# Patient Record
Sex: Male | Born: 1975 | Race: Black or African American | Hispanic: No | Marital: Single | Smoking: Never smoker
Health system: Southern US, Community
[De-identification: ages and names within clinical notes are randomized; demographics above are authoritative.]

## PROBLEM LIST (undated history)

## (undated) DIAGNOSIS — K56609 Unspecified intestinal obstruction, unspecified as to partial versus complete obstruction: Secondary | ICD-10-CM

## (undated) DIAGNOSIS — I10 Essential (primary) hypertension: Secondary | ICD-10-CM

## (undated) DIAGNOSIS — D571 Sickle-cell disease without crisis: Secondary | ICD-10-CM

## (undated) DIAGNOSIS — J309 Allergic rhinitis, unspecified: Secondary | ICD-10-CM

## (undated) DIAGNOSIS — H918X9 Other specified hearing loss, unspecified ear: Secondary | ICD-10-CM

## (undated) DIAGNOSIS — R9431 Abnormal electrocardiogram [ECG] [EKG]: Secondary | ICD-10-CM

## (undated) DIAGNOSIS — N4830 Priapism, unspecified: Secondary | ICD-10-CM

## (undated) DIAGNOSIS — J449 Chronic obstructive pulmonary disease, unspecified: Secondary | ICD-10-CM

## (undated) DIAGNOSIS — R011 Cardiac murmur, unspecified: Secondary | ICD-10-CM

## (undated) DIAGNOSIS — I509 Heart failure, unspecified: Secondary | ICD-10-CM

## (undated) DIAGNOSIS — N19 Unspecified kidney failure: Secondary | ICD-10-CM

## (undated) DIAGNOSIS — H5440 Blindness, one eye, unspecified eye: Secondary | ICD-10-CM

## (undated) HISTORY — DX: Heart failure, unspecified: I50.9

## (undated) HISTORY — DX: Blindness, one eye, unspecified eye: H54.40

## (undated) HISTORY — DX: Allergic rhinitis, unspecified: J30.9

## (undated) HISTORY — DX: Priapism, unspecified: N48.30

## (undated) HISTORY — DX: Other specified hearing loss, unspecified ear: H91.8X9

## (undated) HISTORY — PX: APPENDECTOMY: SHX54

## (undated) HISTORY — DX: Essential (primary) hypertension: I10

## (undated) HISTORY — PX: CHOLECYSTECTOMY: SHX55

## (undated) HISTORY — DX: Sickle-cell disease without crisis: D57.1

## (undated) HISTORY — PX: OTHER SURGICAL HISTORY: SHX169

## (undated) HISTORY — DX: Unspecified kidney failure: N19

## (undated) HISTORY — DX: Abnormal electrocardiogram (ECG) (EKG): R94.31

## (undated) HISTORY — DX: Unspecified intestinal obstruction, unspecified as to partial versus complete obstruction: K56.609

## (undated) HISTORY — DX: Cardiac murmur, unspecified: R01.1

## (undated) HISTORY — DX: Chronic obstructive pulmonary disease, unspecified: J44.9

---

## 1998-03-13 ENCOUNTER — Emergency Department (HOSPITAL_COMMUNITY): Admission: EM | Admit: 1998-03-13 | Discharge: 1998-03-14 | Payer: Self-pay | Admitting: Emergency Medicine

## 1998-09-30 ENCOUNTER — Emergency Department (HOSPITAL_COMMUNITY): Admission: EM | Admit: 1998-09-30 | Discharge: 1998-09-30 | Payer: Self-pay | Admitting: Emergency Medicine

## 1999-08-15 ENCOUNTER — Encounter: Payer: Self-pay | Admitting: Internal Medicine

## 1999-08-15 ENCOUNTER — Inpatient Hospital Stay (HOSPITAL_COMMUNITY): Admission: EM | Admit: 1999-08-15 | Discharge: 1999-08-28 | Payer: Self-pay | Admitting: Emergency Medicine

## 1999-08-17 ENCOUNTER — Encounter: Payer: Self-pay | Admitting: Pulmonary Disease

## 1999-08-19 ENCOUNTER — Encounter: Payer: Self-pay | Admitting: Pulmonary Disease

## 1999-08-19 ENCOUNTER — Encounter: Payer: Self-pay | Admitting: Cardiology

## 1999-08-22 ENCOUNTER — Encounter: Payer: Self-pay | Admitting: Internal Medicine

## 1999-09-11 ENCOUNTER — Encounter: Payer: Self-pay | Admitting: Internal Medicine

## 1999-09-11 ENCOUNTER — Encounter: Admission: RE | Admit: 1999-09-11 | Discharge: 1999-09-11 | Payer: Self-pay | Admitting: Internal Medicine

## 2000-02-17 ENCOUNTER — Encounter: Payer: Self-pay | Admitting: Emergency Medicine

## 2000-02-18 ENCOUNTER — Observation Stay (HOSPITAL_COMMUNITY): Admission: EM | Admit: 2000-02-18 | Discharge: 2000-02-18 | Payer: Self-pay | Admitting: Emergency Medicine

## 2000-02-23 ENCOUNTER — Encounter: Payer: Self-pay | Admitting: Emergency Medicine

## 2000-02-24 ENCOUNTER — Inpatient Hospital Stay (HOSPITAL_COMMUNITY): Admission: EM | Admit: 2000-02-24 | Discharge: 2000-02-27 | Payer: Self-pay | Admitting: Emergency Medicine

## 2000-02-26 ENCOUNTER — Encounter: Payer: Self-pay | Admitting: Internal Medicine

## 2000-06-29 ENCOUNTER — Inpatient Hospital Stay (HOSPITAL_COMMUNITY): Admission: EM | Admit: 2000-06-29 | Discharge: 2000-07-01 | Payer: Self-pay | Admitting: Emergency Medicine

## 2000-06-29 ENCOUNTER — Encounter: Payer: Self-pay | Admitting: Internal Medicine

## 2000-08-11 ENCOUNTER — Inpatient Hospital Stay (HOSPITAL_COMMUNITY): Admission: EM | Admit: 2000-08-11 | Discharge: 2000-08-22 | Payer: Self-pay | Admitting: Emergency Medicine

## 2000-08-12 ENCOUNTER — Encounter: Payer: Self-pay | Admitting: Internal Medicine

## 2000-08-18 ENCOUNTER — Encounter: Payer: Self-pay | Admitting: Internal Medicine

## 2001-03-21 ENCOUNTER — Emergency Department (HOSPITAL_COMMUNITY): Admission: EM | Admit: 2001-03-21 | Discharge: 2001-03-21 | Payer: Self-pay | Admitting: Emergency Medicine

## 2001-03-21 ENCOUNTER — Encounter: Payer: Self-pay | Admitting: Emergency Medicine

## 2001-05-29 ENCOUNTER — Encounter: Payer: Self-pay | Admitting: Emergency Medicine

## 2001-05-30 ENCOUNTER — Inpatient Hospital Stay (HOSPITAL_COMMUNITY): Admission: EM | Admit: 2001-05-30 | Discharge: 2001-06-02 | Payer: Self-pay | Admitting: Emergency Medicine

## 2001-06-01 ENCOUNTER — Encounter: Payer: Self-pay | Admitting: Internal Medicine

## 2001-12-07 ENCOUNTER — Encounter: Payer: Self-pay | Admitting: Emergency Medicine

## 2001-12-08 ENCOUNTER — Encounter: Payer: Self-pay | Admitting: Internal Medicine

## 2001-12-08 ENCOUNTER — Inpatient Hospital Stay (HOSPITAL_COMMUNITY): Admission: EM | Admit: 2001-12-08 | Discharge: 2001-12-15 | Payer: Self-pay | Admitting: Emergency Medicine

## 2001-12-09 ENCOUNTER — Encounter: Payer: Self-pay | Admitting: Internal Medicine

## 2001-12-11 ENCOUNTER — Encounter: Payer: Self-pay | Admitting: Internal Medicine

## 2002-03-14 ENCOUNTER — Emergency Department (HOSPITAL_COMMUNITY): Admission: EM | Admit: 2002-03-14 | Discharge: 2002-03-14 | Payer: Self-pay | Admitting: Emergency Medicine

## 2002-03-16 ENCOUNTER — Inpatient Hospital Stay (HOSPITAL_COMMUNITY): Admission: AD | Admit: 2002-03-16 | Discharge: 2002-03-26 | Payer: Self-pay | Admitting: Internal Medicine

## 2002-03-17 ENCOUNTER — Encounter: Payer: Self-pay | Admitting: Internal Medicine

## 2002-03-24 ENCOUNTER — Encounter: Payer: Self-pay | Admitting: Internal Medicine

## 2002-04-01 ENCOUNTER — Encounter: Payer: Self-pay | Admitting: Emergency Medicine

## 2002-04-02 ENCOUNTER — Inpatient Hospital Stay (HOSPITAL_COMMUNITY): Admission: EM | Admit: 2002-04-02 | Discharge: 2002-04-14 | Payer: Self-pay | Admitting: Emergency Medicine

## 2002-04-05 ENCOUNTER — Encounter: Payer: Self-pay | Admitting: Internal Medicine

## 2002-04-05 ENCOUNTER — Encounter (INDEPENDENT_AMBULATORY_CARE_PROVIDER_SITE_OTHER): Payer: Self-pay | Admitting: Cardiology

## 2002-04-10 ENCOUNTER — Encounter: Payer: Self-pay | Admitting: Internal Medicine

## 2002-06-14 ENCOUNTER — Inpatient Hospital Stay (HOSPITAL_COMMUNITY): Admission: RE | Admit: 2002-06-14 | Discharge: 2002-06-15 | Payer: Self-pay | Admitting: Internal Medicine

## 2002-07-06 ENCOUNTER — Encounter: Admission: RE | Admit: 2002-07-06 | Discharge: 2002-07-06 | Payer: Self-pay | Admitting: Internal Medicine

## 2002-07-06 ENCOUNTER — Encounter: Payer: Self-pay | Admitting: Internal Medicine

## 2002-07-22 ENCOUNTER — Encounter: Payer: Self-pay | Admitting: Internal Medicine

## 2002-07-22 ENCOUNTER — Inpatient Hospital Stay (HOSPITAL_COMMUNITY): Admission: EM | Admit: 2002-07-22 | Discharge: 2002-07-28 | Payer: Self-pay | Admitting: Emergency Medicine

## 2002-07-26 ENCOUNTER — Encounter: Payer: Self-pay | Admitting: Internal Medicine

## 2002-08-13 ENCOUNTER — Encounter (HOSPITAL_COMMUNITY): Admission: RE | Admit: 2002-08-13 | Discharge: 2002-11-11 | Payer: Self-pay | Admitting: Internal Medicine

## 2002-11-12 ENCOUNTER — Inpatient Hospital Stay (HOSPITAL_COMMUNITY): Admission: AD | Admit: 2002-11-12 | Discharge: 2002-11-16 | Payer: Self-pay | Admitting: Internal Medicine

## 2003-02-10 ENCOUNTER — Encounter: Admission: RE | Admit: 2003-02-10 | Discharge: 2003-02-10 | Payer: Self-pay | Admitting: Urology

## 2003-02-10 ENCOUNTER — Encounter: Payer: Self-pay | Admitting: Urology

## 2003-02-14 ENCOUNTER — Ambulatory Visit (HOSPITAL_BASED_OUTPATIENT_CLINIC_OR_DEPARTMENT_OTHER): Admission: RE | Admit: 2003-02-14 | Discharge: 2003-02-14 | Payer: Self-pay | Admitting: Urology

## 2003-03-21 ENCOUNTER — Emergency Department (HOSPITAL_COMMUNITY): Admission: EM | Admit: 2003-03-21 | Discharge: 2003-03-21 | Payer: Self-pay | Admitting: Emergency Medicine

## 2003-05-05 ENCOUNTER — Emergency Department (HOSPITAL_COMMUNITY): Admission: EM | Admit: 2003-05-05 | Discharge: 2003-05-05 | Payer: Self-pay | Admitting: Emergency Medicine

## 2003-05-09 ENCOUNTER — Emergency Department (HOSPITAL_COMMUNITY): Admission: EM | Admit: 2003-05-09 | Discharge: 2003-05-09 | Payer: Self-pay | Admitting: Emergency Medicine

## 2003-07-04 ENCOUNTER — Emergency Department (HOSPITAL_COMMUNITY): Admission: EM | Admit: 2003-07-04 | Discharge: 2003-07-04 | Payer: Self-pay | Admitting: Emergency Medicine

## 2003-09-09 ENCOUNTER — Emergency Department (HOSPITAL_COMMUNITY): Admission: EM | Admit: 2003-09-09 | Discharge: 2003-09-09 | Payer: Self-pay | Admitting: *Deleted

## 2003-12-21 ENCOUNTER — Emergency Department (HOSPITAL_COMMUNITY): Admission: EM | Admit: 2003-12-21 | Discharge: 2003-12-21 | Payer: Self-pay | Admitting: Emergency Medicine

## 2004-05-16 ENCOUNTER — Encounter: Admission: RE | Admit: 2004-05-16 | Discharge: 2004-05-16 | Payer: Self-pay | Admitting: Internal Medicine

## 2004-09-11 ENCOUNTER — Inpatient Hospital Stay (HOSPITAL_COMMUNITY): Admission: EM | Admit: 2004-09-11 | Discharge: 2004-09-14 | Payer: Self-pay | Admitting: Emergency Medicine

## 2004-12-23 ENCOUNTER — Inpatient Hospital Stay (HOSPITAL_COMMUNITY): Admission: EM | Admit: 2004-12-23 | Discharge: 2004-12-31 | Payer: Self-pay | Admitting: *Deleted

## 2005-07-17 ENCOUNTER — Emergency Department (HOSPITAL_COMMUNITY): Admission: EM | Admit: 2005-07-17 | Discharge: 2005-07-17 | Payer: Self-pay | Admitting: Emergency Medicine

## 2005-12-24 ENCOUNTER — Emergency Department (HOSPITAL_COMMUNITY): Admission: EM | Admit: 2005-12-24 | Discharge: 2005-12-24 | Payer: Self-pay | Admitting: Emergency Medicine

## 2006-03-28 ENCOUNTER — Encounter: Admission: RE | Admit: 2006-03-28 | Discharge: 2006-03-28 | Payer: Self-pay | Admitting: Internal Medicine

## 2006-04-12 ENCOUNTER — Emergency Department (HOSPITAL_COMMUNITY): Admission: EM | Admit: 2006-04-12 | Discharge: 2006-04-12 | Payer: Self-pay | Admitting: Emergency Medicine

## 2006-07-14 ENCOUNTER — Encounter: Admission: RE | Admit: 2006-07-14 | Discharge: 2006-07-14 | Payer: Self-pay | Admitting: Internal Medicine

## 2006-09-17 ENCOUNTER — Emergency Department (HOSPITAL_COMMUNITY): Admission: EM | Admit: 2006-09-17 | Discharge: 2006-09-18 | Payer: Self-pay | Admitting: Emergency Medicine

## 2007-06-13 ENCOUNTER — Emergency Department (HOSPITAL_COMMUNITY): Admission: EM | Admit: 2007-06-13 | Discharge: 2007-06-13 | Payer: Self-pay | Admitting: Emergency Medicine

## 2008-06-22 ENCOUNTER — Emergency Department (HOSPITAL_COMMUNITY): Admission: EM | Admit: 2008-06-22 | Discharge: 2008-06-22 | Payer: Self-pay | Admitting: Emergency Medicine

## 2008-11-01 ENCOUNTER — Encounter: Admission: RE | Admit: 2008-11-01 | Discharge: 2008-11-01 | Payer: Self-pay | Admitting: Internal Medicine

## 2009-01-07 ENCOUNTER — Emergency Department (HOSPITAL_COMMUNITY): Admission: EM | Admit: 2009-01-07 | Discharge: 2009-01-08 | Payer: Self-pay | Admitting: Emergency Medicine

## 2009-07-06 ENCOUNTER — Emergency Department (HOSPITAL_COMMUNITY): Admission: EM | Admit: 2009-07-06 | Discharge: 2009-07-06 | Payer: Self-pay | Admitting: Emergency Medicine

## 2009-08-04 ENCOUNTER — Emergency Department (HOSPITAL_COMMUNITY): Admission: EM | Admit: 2009-08-04 | Discharge: 2009-08-04 | Payer: Self-pay | Admitting: Emergency Medicine

## 2009-08-08 ENCOUNTER — Emergency Department (HOSPITAL_COMMUNITY): Admission: EM | Admit: 2009-08-08 | Discharge: 2009-08-08 | Payer: Self-pay | Admitting: Emergency Medicine

## 2009-11-27 ENCOUNTER — Inpatient Hospital Stay (HOSPITAL_COMMUNITY): Admission: EM | Admit: 2009-11-27 | Discharge: 2009-11-29 | Payer: Self-pay | Admitting: Emergency Medicine

## 2010-06-17 ENCOUNTER — Emergency Department (HOSPITAL_COMMUNITY): Admission: EM | Admit: 2010-06-17 | Discharge: 2010-06-17 | Payer: Self-pay | Admitting: Emergency Medicine

## 2010-08-21 ENCOUNTER — Encounter: Admission: RE | Admit: 2010-08-21 | Discharge: 2010-08-21 | Payer: Self-pay | Admitting: Internal Medicine

## 2010-11-03 IMAGING — CR DG CHEST 2V
2 series · 2 of 2 positions shown · non-contrast
Comparison: 03/28/2006

CLINICAL DATA: Chest pain and shortness of breath history sickle
cell disease.

CHEST - 2 VIEW

[view not recorded (1 of 2)]
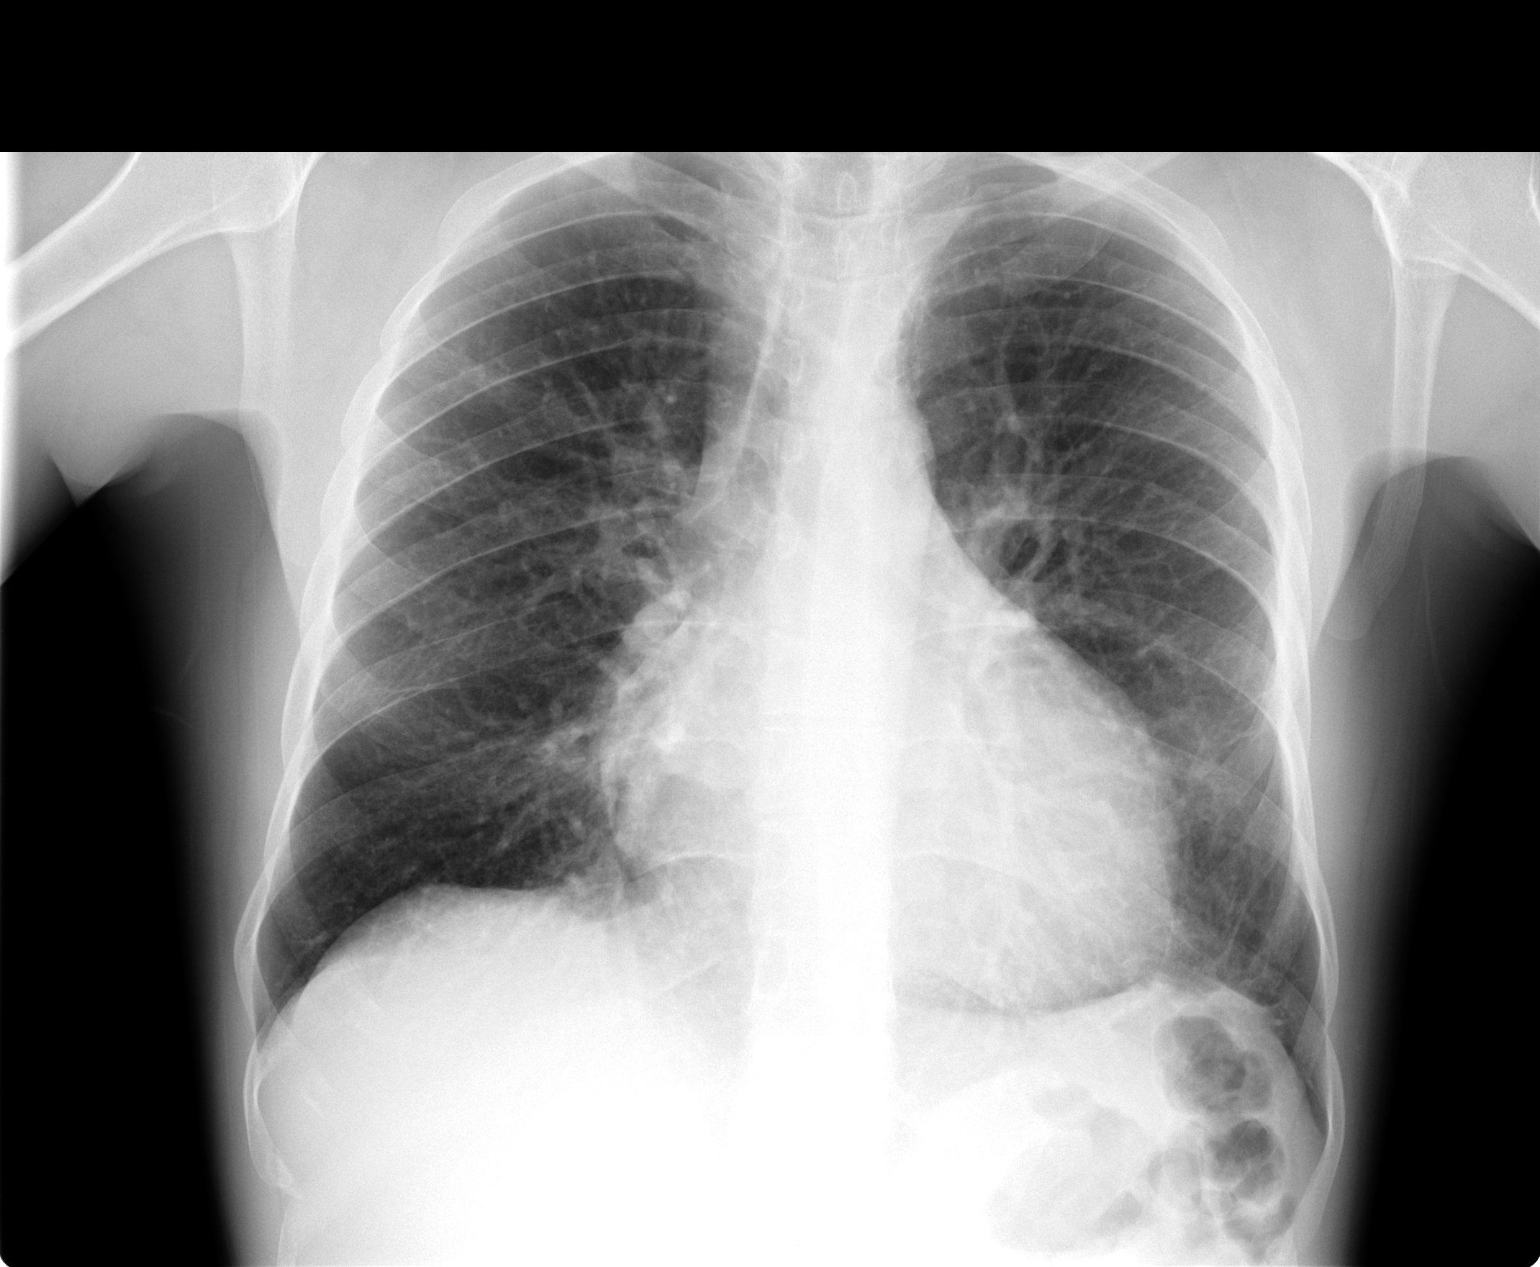

[view not recorded (2 of 2)]
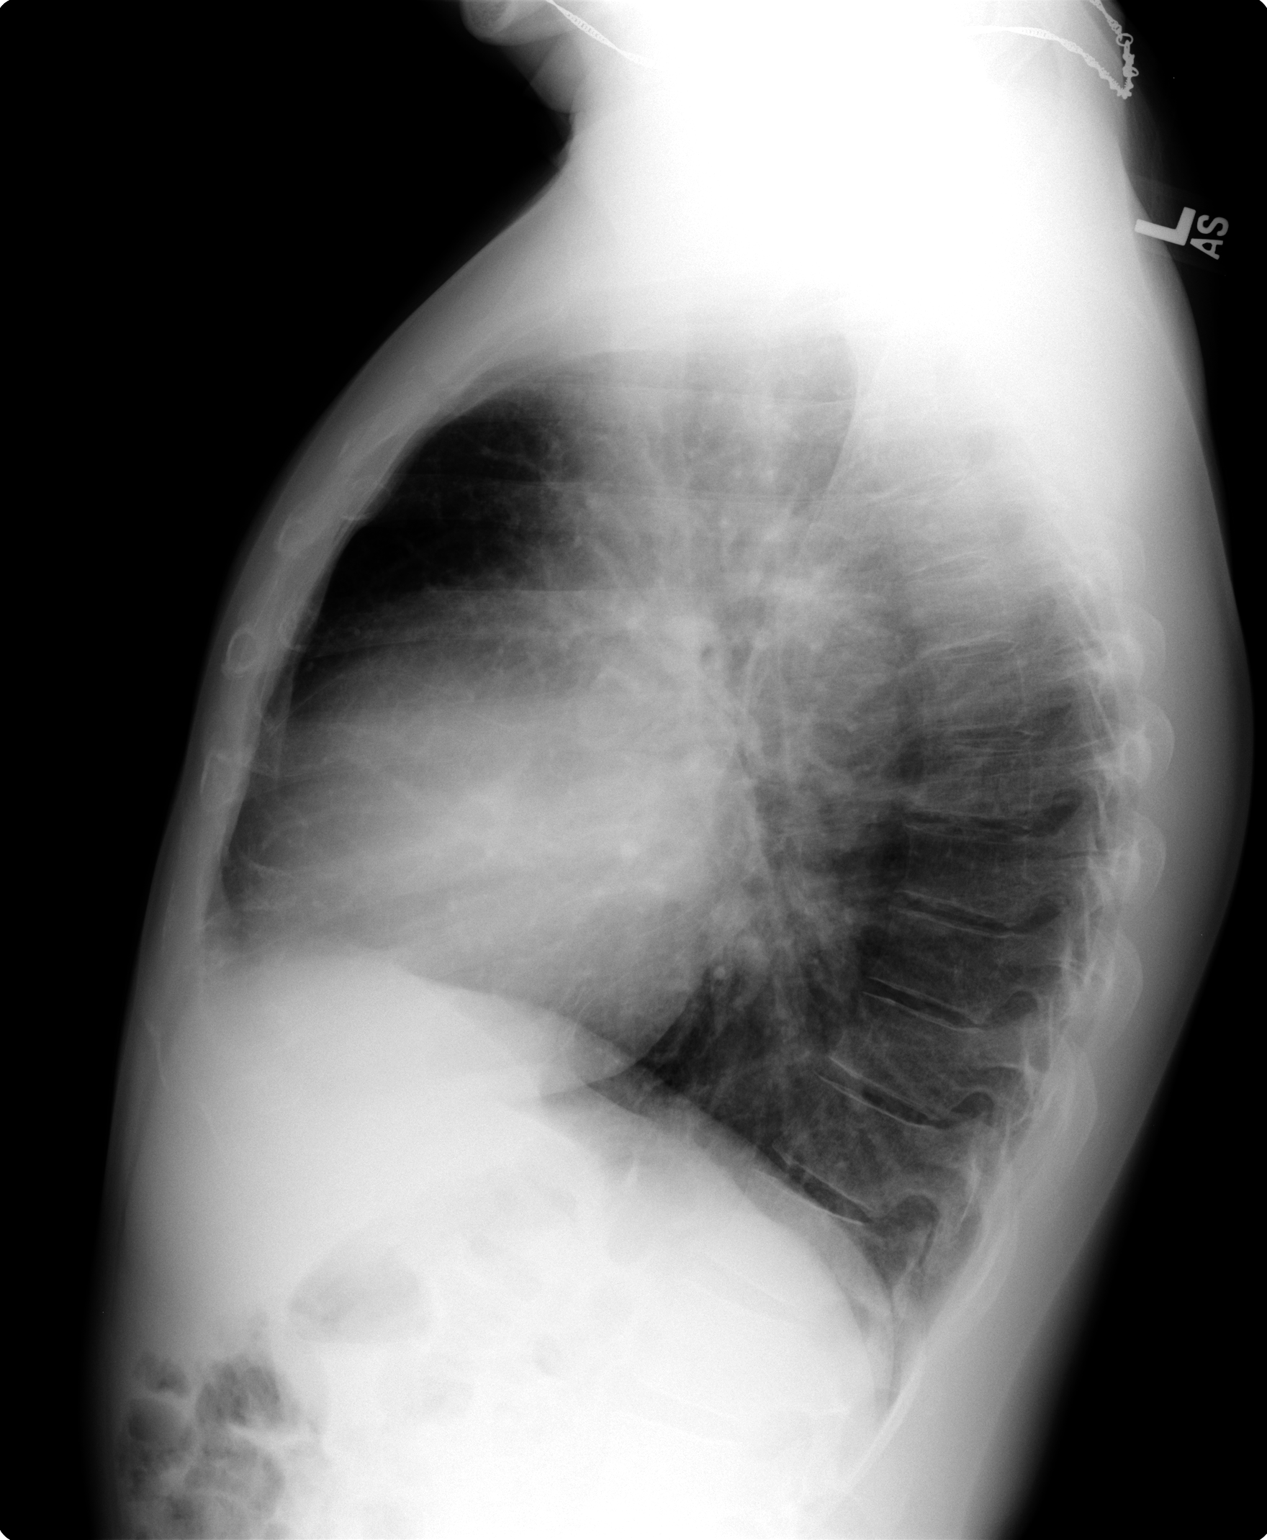

[2 of 2 positions shown; findings below may reference images not displayed]

FINDINGS: Minimal lingular scarring.  No active infiltrate.
Cardiomegaly.  Mild pulmonary vascular congestion.  No pulmonary
edema or pleural effusions.
IMPRESSION: Negative for pneumonia.  Cardiomegaly and mild pulmonary vascular
congestion.

REF:G3 DICTATED: 11/01/2008 [DATE]

## 2010-12-03 DIAGNOSIS — R9431 Abnormal electrocardiogram [ECG] [EKG]: Secondary | ICD-10-CM

## 2010-12-03 HISTORY — DX: Abnormal electrocardiogram (ECG) (EKG): R94.31

## 2010-12-05 ENCOUNTER — Other Ambulatory Visit (HOSPITAL_COMMUNITY): Payer: Self-pay | Admitting: Cardiology

## 2010-12-07 ENCOUNTER — Inpatient Hospital Stay (HOSPITAL_COMMUNITY)
Admission: EM | Admit: 2010-12-07 | Discharge: 2010-12-14 | DRG: 811 | Disposition: A | Discharge status: Home / Self Care | Payer: PRIVATE HEALTH INSURANCE | Attending: Internal Medicine | Admitting: Internal Medicine

## 2010-12-07 ENCOUNTER — Inpatient Hospital Stay (HOSPITAL_COMMUNITY): Payer: PRIVATE HEALTH INSURANCE

## 2010-12-07 DIAGNOSIS — J810 Acute pulmonary edema: Secondary | ICD-10-CM | POA: Diagnosis present

## 2010-12-07 DIAGNOSIS — E872 Acidosis, unspecified: Secondary | ICD-10-CM | POA: Diagnosis present

## 2010-12-07 DIAGNOSIS — J45909 Unspecified asthma, uncomplicated: Secondary | ICD-10-CM | POA: Diagnosis present

## 2010-12-07 DIAGNOSIS — R011 Cardiac murmur, unspecified: Secondary | ICD-10-CM | POA: Diagnosis present

## 2010-12-07 DIAGNOSIS — D5700 Hb-SS disease with crisis, unspecified: Principal | ICD-10-CM | POA: Diagnosis present

## 2010-12-07 DIAGNOSIS — R609 Edema, unspecified: Secondary | ICD-10-CM | POA: Diagnosis present

## 2010-12-07 DIAGNOSIS — E875 Hyperkalemia: Secondary | ICD-10-CM | POA: Diagnosis not present

## 2010-12-07 DIAGNOSIS — I129 Hypertensive chronic kidney disease with stage 1 through stage 4 chronic kidney disease, or unspecified chronic kidney disease: Secondary | ICD-10-CM | POA: Diagnosis present

## 2010-12-07 DIAGNOSIS — I2789 Other specified pulmonary heart diseases: Secondary | ICD-10-CM | POA: Diagnosis present

## 2010-12-07 DIAGNOSIS — N184 Chronic kidney disease, stage 4 (severe): Secondary | ICD-10-CM | POA: Diagnosis present

## 2010-12-07 DIAGNOSIS — H5440 Blindness, one eye, unspecified eye: Secondary | ICD-10-CM | POA: Diagnosis present

## 2010-12-07 DIAGNOSIS — M899 Disorder of bone, unspecified: Secondary | ICD-10-CM | POA: Diagnosis present

## 2010-12-07 DIAGNOSIS — D72829 Elevated white blood cell count, unspecified: Secondary | ICD-10-CM | POA: Diagnosis present

## 2010-12-07 LAB — PROTIME-INR
INR: 1.26 (ref 0.00–1.49)
Prothrombin Time: 16 seconds — ABNORMAL HIGH (ref 11.6–15.2)

## 2010-12-07 LAB — BASIC METABOLIC PANEL
BUN: 32 mg/dL — ABNORMAL HIGH (ref 6–23)
Chloride: 112 mEq/L (ref 96–112)
Creatinine, Ser: 3.39 mg/dL — ABNORMAL HIGH (ref 0.4–1.5)
GFR calc Af Amer: 25 mL/min — ABNORMAL LOW (ref >60)
GFR calc non Af Amer: 21 mL/min — ABNORMAL LOW (ref >60)
Potassium: 4.5 mEq/L (ref 3.5–5.1)

## 2010-12-07 LAB — DIFFERENTIAL
Basophils Absolute: 0.2 10*3/uL — ABNORMAL HIGH (ref 0.0–0.1)
Eosinophils Absolute: 0.2 10*3/uL (ref 0.0–0.7)
Lymphs Abs: 1.2 10*3/uL (ref 0.7–4.0)
Monocytes Relative: 11 % (ref 3–12)
Neutro Abs: 15.5 10*3/uL — ABNORMAL HIGH (ref 1.7–7.7)

## 2010-12-07 LAB — CBC
MCH: 40.2 pg — ABNORMAL HIGH (ref 26.0–34.0)
MCHC: 35.8 g/dL (ref 30.0–36.0)
Platelets: 315 10*3/uL (ref 150–400)
RDW: 23.1 % — ABNORMAL HIGH (ref 11.5–15.5)

## 2010-12-07 LAB — HEPATIC FUNCTION PANEL
Alkaline Phosphatase: 95 U/L (ref 39–117)
Bilirubin, Direct: 0.6 mg/dL — ABNORMAL HIGH (ref 0.0–0.3)
Indirect Bilirubin: 2.1 mg/dL — ABNORMAL HIGH (ref 0.3–0.9)
Total Bilirubin: 2.7 mg/dL — ABNORMAL HIGH (ref 0.3–1.2)

## 2010-12-07 LAB — RETICULOCYTES
RBC.: 1.32 MIL/uL — ABNORMAL LOW (ref 4.22–5.81)
Retic Ct Pct: 18.6 % — ABNORMAL HIGH (ref 0.4–3.1)

## 2010-12-07 LAB — APTT: aPTT: 38 seconds — ABNORMAL HIGH (ref 24–37)

## 2010-12-07 LAB — ABO/RH: ABO/RH(D): O POS

## 2010-12-07 LAB — IRON AND TIBC: TIBC: 239 ug/dL (ref 215–435)

## 2010-12-07 LAB — VITAMIN B12: Vitamin B-12: 807 pg/mL (ref 211–911)

## 2010-12-07 LAB — FOLATE: Folate: 7.8 ng/mL

## 2010-12-08 LAB — COMPREHENSIVE METABOLIC PANEL
ALT: 43 U/L (ref 0–53)
AST: 102 U/L — ABNORMAL HIGH (ref 0–37)
Alkaline Phosphatase: 93 U/L (ref 39–117)
CO2: 22 mEq/L (ref 19–32)
Calcium: 8.5 mg/dL (ref 8.4–10.5)
GFR calc Af Amer: 17 mL/min — ABNORMAL LOW (ref >60)
GFR calc non Af Amer: 14 mL/min — ABNORMAL LOW (ref >60)
Potassium: 5.6 mEq/L — ABNORMAL HIGH (ref 3.5–5.1)
Sodium: 140 mEq/L (ref 135–145)

## 2010-12-08 LAB — HEMOGLOBIN AND HEMATOCRIT, BLOOD
HCT: 20.8 % — ABNORMAL LOW (ref 39.0–52.0)
Hemoglobin: 7.2 g/dL — ABNORMAL LOW (ref 13.0–17.0)

## 2010-12-08 LAB — PROTIME-INR: Prothrombin Time: 16.4 seconds — ABNORMAL HIGH (ref 11.6–15.2)

## 2010-12-08 LAB — CARDIAC PANEL(CRET KIN+CKTOT+MB+TROPI): Relative Index: 1.2 (ref 0.0–2.5)

## 2010-12-08 LAB — CBC
Hemoglobin: 6.8 g/dL — CL (ref 13.0–17.0)
MCHC: 34.9 g/dL (ref 30.0–36.0)
RBC: 1.96 MIL/uL — ABNORMAL LOW (ref 4.22–5.81)
WBC: 15.7 10*3/uL — ABNORMAL HIGH (ref 4.0–10.5)

## 2010-12-08 LAB — APTT: aPTT: 45 seconds — ABNORMAL HIGH (ref 24–37)

## 2010-12-08 LAB — PREPARE RBC (CROSSMATCH)

## 2010-12-09 ENCOUNTER — Inpatient Hospital Stay (HOSPITAL_COMMUNITY): Payer: PRIVATE HEALTH INSURANCE

## 2010-12-09 LAB — TYPE AND SCREEN
ABO/RH(D): O POS
Antibody Screen: NEGATIVE
Unit division: 0
Unit division: 0
Unit division: 0

## 2010-12-09 LAB — COMPREHENSIVE METABOLIC PANEL
Alkaline Phosphatase: 87 U/L (ref 39–117)
BUN: 66 mg/dL — ABNORMAL HIGH (ref 6–23)
Calcium: 8.3 mg/dL — ABNORMAL LOW (ref 8.4–10.5)
Creatinine, Ser: 6.05 mg/dL — ABNORMAL HIGH (ref 0.4–1.5)
Glucose, Bld: 124 mg/dL — ABNORMAL HIGH (ref 70–99)
Total Protein: 5.7 g/dL — ABNORMAL LOW (ref 6.0–8.3)

## 2010-12-09 LAB — URINALYSIS, ROUTINE W REFLEX MICROSCOPIC
Leukocytes, UA: NEGATIVE
Nitrite: NEGATIVE
Protein, ur: >300 mg/dL — AB
Specific Gravity, Urine: 1.017 (ref 1.005–1.030)
Urobilinogen, UA: 0.2 mg/dL (ref 0.0–1.0)

## 2010-12-09 LAB — MAGNESIUM: Magnesium: 2.4 mg/dL (ref 1.5–2.5)

## 2010-12-09 LAB — CBC
HCT: 19.9 % — ABNORMAL LOW (ref 39.0–52.0)
MCH: 34.1 pg — ABNORMAL HIGH (ref 26.0–34.0)
MCHC: 35.2 g/dL (ref 30.0–36.0)
MCV: 97.1 fL (ref 78.0–100.0)
RDW: 22.2 % — ABNORMAL HIGH (ref 11.5–15.5)

## 2010-12-09 LAB — DIFFERENTIAL
Eosinophils Relative: 1 % (ref 0–5)
Lymphs Abs: 0.7 10*3/uL (ref 0.7–4.0)
Monocytes Relative: 13 % — ABNORMAL HIGH (ref 3–12)
Neutro Abs: 14.1 10*3/uL — ABNORMAL HIGH (ref 1.7–7.7)

## 2010-12-09 LAB — URINE MICROSCOPIC-ADD ON

## 2010-12-10 LAB — COMPREHENSIVE METABOLIC PANEL
ALT: 35 U/L (ref 0–53)
Alkaline Phosphatase: 97 U/L (ref 39–117)
BUN: 68 mg/dL — ABNORMAL HIGH (ref 6–23)
CO2: 21 mEq/L (ref 19–32)
Calcium: 9.1 mg/dL (ref 8.4–10.5)
GFR calc non Af Amer: 11 mL/min — ABNORMAL LOW (ref >60)
Glucose, Bld: 112 mg/dL — ABNORMAL HIGH (ref 70–99)
Sodium: 141 mEq/L (ref 135–145)

## 2010-12-10 LAB — DIFFERENTIAL
Basophils Relative: 1 % (ref 0–1)
Eosinophils Relative: 2 % (ref 0–5)
Lymphs Abs: 1.3 10*3/uL (ref 0.7–4.0)
Monocytes Absolute: 2.4 10*3/uL — ABNORMAL HIGH (ref 0.1–1.0)
Monocytes Relative: 13 % — ABNORMAL HIGH (ref 3–12)
Neutro Abs: 14.3 10*3/uL — ABNORMAL HIGH (ref 1.7–7.7)

## 2010-12-10 LAB — PHOSPHORUS: Phosphorus: 6.4 mg/dL — ABNORMAL HIGH (ref 2.3–4.6)

## 2010-12-10 LAB — CBC
HCT: 20.8 % — ABNORMAL LOW (ref 39.0–52.0)
Hemoglobin: 7.3 g/dL — ABNORMAL LOW (ref 13.0–17.0)
MCHC: 35.1 g/dL (ref 30.0–36.0)
MCV: 98.1 fL (ref 78.0–100.0)

## 2010-12-10 LAB — RAPID URINE DRUG SCREEN, HOSP PERFORMED
Amphetamines: NOT DETECTED
Cocaine: POSITIVE — AB
Opiates: POSITIVE — AB
Tetrahydrocannabinol: NOT DETECTED

## 2010-12-10 LAB — VITAMIN D 25 HYDROXY (VIT D DEFICIENCY, FRACTURES): Vit D, 25-Hydroxy: <10 ng/mL — ABNORMAL LOW (ref 30–89)

## 2010-12-10 LAB — C4 COMPLEMENT: Complement C4, Body Fluid: 21 mg/dL (ref 16–47)

## 2010-12-10 LAB — MAGNESIUM: Magnesium: 2.4 mg/dL (ref 1.5–2.5)

## 2010-12-10 LAB — PROTEIN / CREATININE RATIO, URINE: Creatinine, Urine: 73.3 mg/dL

## 2010-12-10 NOTE — Discharge Summary (Signed)
NAME:  [name], [name]           ACCOUNT NO.:  [id number]  MEDICAL RECORD NO.:  [id number]           PATIENT TYPE:  I  LOCATION:  1423                         FACILITY:  [facility]  PHYSICIAN:  Talmage Nap, MD  DATE OF BIRTH:  11/30/1975  DATE OF ADMISSION:  12/07/2010 DATE OF DISCHARGE:                        DISCHARGE SUMMARY - REFERRING   DATE OF DISCHARGE:  Undetermined.  PRIMARY CARE PHYSICIAN:  Eric L. August Saucer, M.D.  CARDIOLOGISTEduardo Osier Sharyn Lull, M.D.  CURRENT DIAGNOSES: 1. Sickle cell disease/bone pain crisis, resolving on Vicodin. 2. Anemia. 3. Sickle cell disease/anemia, status post packed rbc's transfusion. 4. Blindness, right eye. 5. Chronic kidney disease-blood urea nitrogen and creatinine trending     up. 6. Acidosis. 7. Hyperkalemia. 8. Hypertension. 9. Leukocytosis, source is unknown. 10.Pulmonary hypertension.  HISTORY:  The patient is a 35 year old very pleasant African American male with history of sickle cell disease and chronic renal insufficiency who was admitted to the hospital on December 07, 2010, by Dr. Calvert Cantor with 3-4 days' history of pain in the knees bilaterally.  There was however no associated systemic symptoms.  No chest pain, no shortness of breath.  No nausea or vomiting.  The patient was initially seen in his PCP's office and he was found to have hemoglobin level of 5.3 and in direct contact with his PCP, the patient was said to have complained about being fatigued and having shortness of breath on the evaluation but he denied any chest pain.  He was however sent to the hospital for further evaluation and most likely for blood transfusion.  PREADMISSION MEDICATIONS: 1. Uloric 40 mg p.o. daily. 2. Toprol XL 25 mg p.o. daily. 3. Singulair 10 mg p.o. daily. 4. Multivitamin 1 p.o. daily. 5. Hydroxyurea 500 mg 1 p.o. daily. 6. Diltiazem CD 120 mg p.o. at bedtime. 7. Folic acid 2 mg daily. 8. Aspirin 81 mg p.o. daily. 9. Calcium  600/400 with vitamin D 1 p.o. daily.  ALLERGIES:  He has no known allergies.  SOCIAL HISTORY:  Negative for alcohol or tobacco use and the patient is a Optician, dispensing.  FAMILY HISTORY:  York Spaniel to be positive for diabetes mellitus, hypertension, and dyslipidemia as well as blood dyscrasia.  REVIEW OF SYSTEMS:  Essentially documented in the initial history and physical.  PHYSICAL EXAMINATION:  GENERAL:  At the time the patient was seen by the admitting physician, he was said not to be in any acute distress. VITAL SIGNS:  Blood pressure 153/84, pulse 112, respiratory rate is 18, temperature is 99.3, and he was saturating 83% on room air, and when put on O2 via nasal cannula at 4 L, saturation improved to 95%. HEENT:  Blind right eyeball.  Left pupil is reactive to light and it was pale. NECK:  Had no jugular venous distention.  No carotid bruit.  No lymphadenopathy. CHEST:  Fine crackles bibasilar. HEART:  Sounds are S1 and S2 with soft systolic murmur. ABDOMEN:  Soft, nontender.  Liver, spleen, and kidney not palpable. Bowel sounds are positive. EXTREMITIES:  Show pedal edema. NEUROLOGIC:  Nonfocal. MUSCULOSKELETAL SYSTEM:  Unremarkable. SKIN:  Normal turgor.  LABORATORY DATA:  Coagulation profile showed PT  16.0, INR 1.26, and aPTT 38.  Basic metabolic panel showed sodium of 139, potassium of 4.5, chloride of 112 with a bicarb of 17, glucose is 148, BUN is 32, and creatinine is 3.39.  Retic count 18.6, and absolute reticulocyte count is 245.5.  Complete blood count with differential showed WBC of 19.2, hemoglobin of 5.3, hematocrit of 14.8, MCV of 112.1 with a platelet count of 315,000.  LFTs showed total bilirubin 2.7, direct 0.6, indirect 2.1, alkaline phosphatase 695, AST 46 and ALT 19.  Anemia panel done showed a serum iron 223 elevated, total iron binding capacity is 239, percentage saturation is 93, UIBC 16 and vitamin B12 of 827.  Cardiac markers, troponin-I 0.08.  A repeat  complete blood count with differential done on December 09, 2010, showed WBC of 17.5, hemoglobin of 7.0, hematocrit 19.9, MCV of 97.1, platelet count of 272,000, magnesium level is 2.4 and a comprehensive metabolic panel showed sodium of 141, potassium of 6.0, chloride of 115 with bicarb of 17, glucose is 124, BUN is 66, creatinine 6.05 and magnesium level is 2.4.  IMAGING STUDIES: 1. Chest x-ray which showed cardiomegaly with mild interstitial edema. 2. A 2-D echo showed mild LVH, EF of 50-55% with a PA pressure of 57     mmHg.  HOSPITAL COURSE:  The patient was admitted to telemetry.  He was started on normal saline IV to go at rate of 125 mL an hour.  He was typed and crossed, transfused with 2 units of packed rbc's.  He was also given heparin subcutaneous for DVT prophylaxis, and Tylenol and Percocet for control of pain.  Also given to the patient was Dilaudid 1-2 mg q.2h. p.r.n. and Benadryl 25-50 mg p.o. for pruritus.  Toprol and diltiazem were discontinued by the cardiologist, Dr. Sharyn Lull.  The patient was however placed on carvedilol 6.25 mg p.o. b.i.d.  Other medications given to the patient include folic acid 2 mg p.o. daily, hydroxyurea 500 mg p.o. daily, Singulair 10 mg p.o. daily, and aspirin 81 mg p.o. daily. At time the patient was seen by me for the very first time which was on December 08, 2010, he denied any chest pain or shortness of breath.  He admitted that the pain in the knees were less, and examination of the patient was essentially unremarkable apart from the patient being in pain and having soft systolic murmur.  Vital signs were stable, i.e. blood pressure 129/84, pulse 75, respiratory rate 20, temperature is 98.4.  After evaluating and going through the patient's lab work done which showed the patient had leukocytosis and source unknown and this cannot be totally attributed to hemolysis, because of this, the patient was started empirically on Rocephin 1 g IV  q.24h., and blood cultures x2 were ordered before starting IV antibiotics.  The patient was reevaluated by me today, which is December 09, 2010.  Denies any chest pain, however a review of his lab work showed that BUN and creatinine are trending up with hyperkalemia and acidosis.  The plan therefore is to continue the patient on Rocephin 1 g IV q.24h.  The patient will be given Kayexalate 15 mg p.o. stat, and Nephrology will be consulted.  He will be followed and evaluated on day-to-day basis.     Talmage Nap, MD     CN/MEDQ  D:  12/09/2010  T:  12/09/2010  Job:  [id number]  Electronically Signed by Talmage Nap  on 12/10/2010 06:41:44 AM

## 2010-12-11 ENCOUNTER — Inpatient Hospital Stay (HOSPITAL_COMMUNITY): Payer: PRIVATE HEALTH INSURANCE

## 2010-12-11 LAB — DIFFERENTIAL
Basophils Absolute: 0.2 10*3/uL — ABNORMAL HIGH (ref 0.0–0.1)
Basophils Relative: 1 % (ref 0–1)
Eosinophils Absolute: 0.4 10*3/uL (ref 0.0–0.7)
Lymphocytes Relative: 9 % — ABNORMAL LOW (ref 12–46)
Lymphs Abs: 1.6 10*3/uL (ref 0.7–4.0)
Monocytes Absolute: 3.5 10*3/uL — ABNORMAL HIGH (ref 0.1–1.0)
Neutrophils Relative %: 68 % (ref 43–77)

## 2010-12-11 LAB — COMPREHENSIVE METABOLIC PANEL
AST: 39 U/L — ABNORMAL HIGH (ref 0–37)
Albumin: 2.9 g/dL — ABNORMAL LOW (ref 3.5–5.2)
Alkaline Phosphatase: 85 U/L (ref 39–117)
Chloride: 108 mEq/L (ref 96–112)
GFR calc Af Amer: 15 mL/min — ABNORMAL LOW (ref >60)
Potassium: 4.3 mEq/L (ref 3.5–5.1)
Sodium: 140 mEq/L (ref 135–145)
Total Bilirubin: 1.8 mg/dL — ABNORMAL HIGH (ref 0.3–1.2)
Total Protein: 6.1 g/dL (ref 6.0–8.3)

## 2010-12-11 LAB — CBC
Platelets: 327 10*3/uL (ref 150–400)
RBC: 2.17 MIL/uL — ABNORMAL LOW (ref 4.22–5.81)
RDW: 25.7 % — ABNORMAL HIGH (ref 11.5–15.5)
WBC: 17.5 10*3/uL — ABNORMAL HIGH (ref 4.0–10.5)

## 2010-12-11 LAB — PTH, INTACT AND CALCIUM: Calcium, Total (PTH): 8.7 mg/dL (ref 8.4–10.5)

## 2010-12-11 LAB — FERRITIN: Ferritin: 1278 ng/mL — ABNORMAL HIGH (ref 22–322)

## 2010-12-11 LAB — BRAIN NATRIURETIC PEPTIDE: Pro B Natriuretic peptide (BNP): 543 pg/mL — ABNORMAL HIGH (ref 0.0–100.0)

## 2010-12-12 LAB — COMPREHENSIVE METABOLIC PANEL
Alkaline Phosphatase: 101 U/L (ref 39–117)
BUN: 66 mg/dL — ABNORMAL HIGH (ref 6–23)
CO2: 27 mEq/L (ref 19–32)
GFR calc non Af Amer: 15 mL/min — ABNORMAL LOW (ref >60)
Glucose, Bld: 118 mg/dL — ABNORMAL HIGH (ref 70–99)
Potassium: 4.2 mEq/L (ref 3.5–5.1)
Total Bilirubin: 1.8 mg/dL — ABNORMAL HIGH (ref 0.3–1.2)
Total Protein: 6.6 g/dL (ref 6.0–8.3)

## 2010-12-12 LAB — MAGNESIUM: Magnesium: 2.2 mg/dL (ref 1.5–2.5)

## 2010-12-12 LAB — CBC
MCHC: 33.2 g/dL (ref 30.0–36.0)
Platelets: 400 10*3/uL (ref 150–400)
RDW: 24.8 % — ABNORMAL HIGH (ref 11.5–15.5)
WBC: 12.7 10*3/uL — ABNORMAL HIGH (ref 4.0–10.5)

## 2010-12-12 LAB — BRAIN NATRIURETIC PEPTIDE: Pro B Natriuretic peptide (BNP): 563 pg/mL — ABNORMAL HIGH (ref 0.0–100.0)

## 2010-12-13 LAB — COMPREHENSIVE METABOLIC PANEL
ALT: 41 U/L (ref 0–53)
AST: 54 U/L — ABNORMAL HIGH (ref 0–37)
Albumin: 3.1 g/dL — ABNORMAL LOW (ref 3.5–5.2)
CO2: 28 mEq/L (ref 19–32)
Chloride: 103 mEq/L (ref 96–112)
GFR calc Af Amer: 21 mL/min — ABNORMAL LOW (ref >60)
GFR calc non Af Amer: 17 mL/min — ABNORMAL LOW (ref >60)
Sodium: 142 mEq/L (ref 135–145)
Total Bilirubin: 1.5 mg/dL — ABNORMAL HIGH (ref 0.3–1.2)

## 2010-12-13 LAB — CBC
HCT: 16.1 % — ABNORMAL LOW (ref 39.0–52.0)
Hemoglobin: 8.1 g/dL — ABNORMAL LOW (ref 13.0–17.0)
Hemoglobin: 6.1 g/dL — CL (ref 13.0–17.0)
MCH: 43 pg — ABNORMAL HIGH (ref 26.0–34.0)
MCHC: 37.8 g/dL — ABNORMAL HIGH (ref 30.0–36.0)
Platelets: 409 10*3/uL — ABNORMAL HIGH (ref 150–400)
RBC: 2.39 MIL/uL — ABNORMAL LOW (ref 4.22–5.81)
RDW: 24.8 % — ABNORMAL HIGH (ref 11.5–15.5)
WBC: 12.2 10*3/uL — ABNORMAL HIGH (ref 4.0–10.5)

## 2010-12-13 LAB — DIFFERENTIAL
Eosinophils Relative: 0 % (ref 0–5)
Lymphs Abs: 1.4 10*3/uL (ref 0.7–4.0)
Monocytes Absolute: 1.7 10*3/uL — ABNORMAL HIGH (ref 0.1–1.0)
Monocytes Relative: 10 % (ref 3–12)
Neutro Abs: 13.8 10*3/uL — ABNORMAL HIGH (ref 1.7–7.7)

## 2010-12-13 LAB — BASIC METABOLIC PANEL
BUN: 33 mg/dL — ABNORMAL HIGH (ref 6–23)
CO2: 20 mEq/L (ref 19–32)
Calcium: 8.9 mg/dL (ref 8.4–10.5)
GFR calc non Af Amer: 20 mL/min — ABNORMAL LOW (ref >60)
Glucose, Bld: 92 mg/dL (ref 70–99)
Potassium: 4.3 mEq/L (ref 3.5–5.1)
Sodium: 140 mEq/L (ref 135–145)

## 2010-12-13 LAB — URINALYSIS, ROUTINE W REFLEX MICROSCOPIC
Glucose, UA: NEGATIVE mg/dL
Leukocytes, UA: NEGATIVE
Protein, ur: >300 mg/dL — AB
Specific Gravity, Urine: 1.013 (ref 1.005–1.030)
pH: 6 (ref 5.0–8.0)

## 2010-12-13 LAB — URINE MICROSCOPIC-ADD ON

## 2010-12-14 LAB — COMPREHENSIVE METABOLIC PANEL
ALT: 37 U/L (ref 0–53)
AST: 51 U/L — ABNORMAL HIGH (ref 0–37)
Alkaline Phosphatase: 107 U/L (ref 39–117)
CO2: 30 mEq/L (ref 19–32)
Calcium: 9.1 mg/dL (ref 8.4–10.5)
GFR calc Af Amer: 21 mL/min — ABNORMAL LOW (ref >60)
GFR calc non Af Amer: 18 mL/min — ABNORMAL LOW (ref >60)
Glucose, Bld: 76 mg/dL (ref 70–99)
Potassium: 4.2 mEq/L (ref 3.5–5.1)
Sodium: 141 mEq/L (ref 135–145)

## 2010-12-14 LAB — CULTURE, BLOOD (ROUTINE X 2)
Culture  Setup Time: 201203102025
Culture  Setup Time: 201203102025
Culture: NO GROWTH

## 2010-12-14 LAB — CBC
HCT: 24.2 % — ABNORMAL LOW (ref 39.0–52.0)
Hemoglobin: 8 g/dL — ABNORMAL LOW (ref 13.0–17.0)
MCHC: 33.1 g/dL (ref 30.0–36.0)
RBC: 2.36 MIL/uL — ABNORMAL LOW (ref 4.22–5.81)
WBC: 9.6 10*3/uL (ref 4.0–10.5)

## 2010-12-17 NOTE — Consult Note (Signed)
NAME:  Douglas, Hardin           ACCOUNT NO.:  [id number]  MEDICAL RECORD NO.:  [id number]           PATIENT TYPE:  I  LOCATION:  1423                         FACILITY:  [facility]  PHYSICIAN:  Maree Krabbe, M.D.DATE OF BIRTH:  1976/06/23  DATE OF CONSULTATION:  12/09/2010 DATE OF DISCHARGE:                                CONSULTATION   REASON FOR CONSULTATION:  Acute-on-chronic renal failure.  HISTORY OF PRESENT ILLNESS:  The patient is a 35 year old pleasant male with a history of sickle-cell disease and chronic kidney disease who was admitted on March 9th with painful crisis and acute-on-chronic anemia. The patient's renal function has deteriorated over the hospital in the last 48 hours and we were asked to see the patient in consultation.  The patient was having pain in both knees about 3-4 days prior to admission.  He had no other associated systemic symptoms, chest pain or shortness of breath, nausea, vomiting or diarrhea.  He was seen in his PCP's office, found to have a hemoglobin level of 5.3 and also was having marked fatigue.  He was sent to the hospital and admitted on March 9th.  On admission, his creatinine was 3.3.  The patient was started on IV fluids at 125 cc/hour and given a blood transfusion of 2 units packed red blood cells.  He received p.r.n. Tylenol and hydrocodone. He also received Dilaudid and Benadryl as needed.  The patient was seen by Cardiology in consultation and his Toprol-XL and diltiazem were stopped.  He was on Coreg 6.25 mg b.i.d. This is by Dr. Lorayne Marek.  A 2-D echo was ordered, which showed normal LV function with an EF of 50% to 55%, concentric LVH and no diastolic dysfunction.  There is some pulmonary hypertension, but the right ventricle had normal systolic function.  Creatinine bumped up to 4.7 on the 2nd hospital day.  He got another unit of blood on the same day. White blood count bumped up also.  There was no fever.  Blood  cultures were drawn and the patient was given one dose of Rocephin on December 08, 2010.  The labs this morning showed creatinine rising further up to 6.0 with potassium of 6.0 and the patient was given Kayexalate 15 g by mouth and nephrology consult was called.  Ultrasound of the kidneys was done today, which showed a right kidney of 10.7 cm and left kidney 11.5 cm. Both kidneys were diffusely echogenic.  No hydronephrosis.  Urinalysis shows greater than 300 protein.  No red blood cells.  Few white blood cells.  Other lab work shows albumin 2.9.  X-ray from admission showed interstitial edema, vascular congestion, cardiomegaly and a patchy infiltrate in the lingula.  Currently the patient says he feels more energy than when he came in the hospital.  He denies any active shortness of breath or chest pain.  Denies any nausea or vomiting.  He says his appetite is excellent.  He is not having any difficulty voiding.  PAST MEDICAL HISTORY:  Chronic kidney disease C.  In 2007, creatinine 0.9.  In 2008, 1.2.  In 2010, 1.6.  In February 2011, 2.8 - 3.0. September 2011,  3.5.  One week ago according to the patient, it was 2.7. Also sickle cell disease, anemia, blindness in the right eye, hypertension of less than 2 years duration and pulmonary hypertension. Asthma.  PAST SURGICAL HISTORY:  Noncontributory.  HOME MEDICATIONS: 1. Uloric. 2. Toprol-XL 25 daily. 3. Singulair 10 daily. 4. Multivitamin. 5. Hydroxyurea 500 one daily. 6. Diltiazem CD 120 at bedtime. 7. Folic acid. 8. Aspirin 81. 9. Calcium with vitamin D.  CURRENT MEDICATIONS: 1. Uloric. 2. Singulair. 3. Multivitamin. 4. Hydroxyurea. 5. Folic acid. 6. Aspirin. 7. Calcium carbonate. 8. Coreg 6.25 b.i.d. 9. Rocephin 1 g IV q.24 hours. 10.P.r.n. medications and pain meds.  No NSAIDs.  The patient has not     received any IV contrast.  ALLERGIES:  None.  SOCIAL HISTORY:  No alcohol or tobacco use.  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  Primary physician is Willey Blade, M.D.  No drug use.  REVIEW OF SYSTEMS:  Denies any recent fever, chills, sweats, headache, visual change, sore throat, difficulty swallowing, chest pain, shortness of breath, orthopnea or PND.  GI:  As above.  GU:  No difficulty voiding or dysuria.  No history of kidney stones or prostate disease. MUSCULOSKELETAL:  Denies any history of arthritis.  He has joint pains related to his sickle cell disease off and on.  He has had ankle swelling here for the last 3 weeks, which is new.  History of asthma. HEMATOLOGIC:  Denies any history of blood clot or cancer.  He has had blood transfusion in the past, but not in excessive amount.  Denies any focal numbness, weakness, history of stroke, TIA or seizure.  SKIN: Denies any skin rash or skin condition.  PHYSICAL EXAMINATION:  VITAL SIGNS:  Blood pressure 119/69, pulse 80, respirations 18, temperature 99.6. GENERAL:  The patient is a well-developed, well-nourished male, in no distress, sitting up, pleasant and calm and fully alert and oriented x3. SKIN:  Warm and dry without rash or cyanosis. HEENT:  PERRLA, EOMI.  Throat is clear and moist. NECK:  Supple.  There is JVD with significantly distended neck veins up to the angle of the jaw. CHEST:  He has bilateral basilar crackles about one-quarter of the posterior lung fields.  No rhonchi or wheezing.  No bronchial breath sounds.  Good air movement. CARDIAC:  Regular rate and rhythm without murmur, rub or gallop.  There may be a soft S4. ABDOMEN:  Slightly distended, nontender, tympanitic.  The liver is enlarged and descends about 5 or 6 cm below the right costal margin. Spleen tip is not palpated.  No obvious ascites and no abdominal tenderness. GU:  Normal male genitalia. EXTREMITIES:  There is pitting edema of the lower legs below the knees bilaterally and also the thighs, right greater than left, about 2+.  No edema of the upper  extremities or face. NEUROLOGIC:  Alert and oriented x3.  Ambulatory.  No focal deficits.  LABORATORY DATA:  From today, sodium 141, potassium 6.0, CO2 is 17, BUN 66, creatinine 6.05, glucose 124, magnesium 2.4, estimated creatinine clearance today is 39 mL per minute, iron saturation 93%, ferritin 764, total bilirubin 2.2, calcium 8.3, phosphorus pending.  Chest x-ray results as above.  Urinalysis as above.  Renal ultrasound as above.  IMPRESSION/PLAN: 1. Acute-on-chronic renal failure.  Uncertain etiology.  There is some     relative drop in blood pressure compared to admission with a drop     in systolic of about 20-30 points, but  no frank hypotension.  The     patient is clearly volume overloaded and I suppose it is possible     that the patient has fallen off the Starling curve and this is a     hemodynamic response, although on the echocardiogram, it looks like     the patient the left and right ventricle function properly.  I do     not see any indication of nephrotoxin administration.  He has only     received one dose of cephalosporin antibiotic making acute allergic     interstitial nephritis unlikely.  No IV contrast, NSAIDs, ACE     inhibitors or ARBs.  The patient does have a significant chronic     kidney disease element, which is stage IV with a baseline GFR     around 25% to 30% (creatinine 2.7-3.3).     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  He does have     proteinuria by dipstick greater than 300 and we will quantitate     this.  There is no significant hematuria to suggest     glomerulonephritis.  For now, we will treat by stopping IV fluids,     give IV Lasix at relatively high doses to try to promote a     diuresis and hopefully the patient will have a hemodynamic     improvement and we can turn around the acute element of his renal     insufficiency.  If not, he may require dialysis within the next few     days.  This was all explained to the  patient who seemed to     understand and I answered his questions accordingly. 2. Chronic kidney disease, as above. 3. Sickle cell disease. 4. Anemia with acute-on-chronic worsening, status post transfusion (3     units of packed red blood cells so far in this admission).     Hemoglobin is up to 7 from the low 5. 5. Infectious disease.  The patient was started on Rocephin for rising     white count of 16,000 to 17,000.  No obvious infection.  We may     need to stop his antibiotics due to rise in creatinine in case of     allergic interstitial nephritis reaction 6. Volume overload with congestive heart failure on exam and chest x-     ray with pulmonary edema on exam and chest x-ray, early onset.     Need to stop fluids and diurese the patient.  Hopefully he will     respond to diuretics.  He is currently nonoliguric and is making     urine. 7. Blind right eye. 8. Hypertension, currently on a beta-blocker only.  Well controlled     blood pressure. 9. History of asthma. 10.History of pulmonary hypertension.  RECOMMENDATIONS:  See orders and as above.  Thank you for the referral.  Please feel free to call with any questions.     Maree Krabbe, M.D.     RDS/MEDQ  D:  12/09/2010  T:  12/10/2010  Job:  [id number]  Electronically Signed by Delano Metz M.D. on 12/17/2010 09:06:19 AM

## 2010-12-19 LAB — DIFFERENTIAL
Blasts: 0 %
Lymphocytes Relative: 7 % — ABNORMAL LOW (ref 12–46)
Lymphs Abs: 2.3 10*3/uL (ref 0.7–4.0)
Neutro Abs: 29.2 10*3/uL — ABNORMAL HIGH (ref 1.7–7.7)
Neutrophils Relative %: 89 % — ABNORMAL HIGH (ref 43–77)
Promyelocytes Absolute: 0 %
nRBC: 0 /100 WBC

## 2010-12-19 LAB — URINE CULTURE
Colony Count: NO GROWTH
Culture: NO GROWTH

## 2010-12-19 LAB — COMPREHENSIVE METABOLIC PANEL
BUN: 23 mg/dL (ref 6–23)
CO2: 23 mEq/L (ref 19–32)
Calcium: 9.1 mg/dL (ref 8.4–10.5)
Chloride: 111 mEq/L (ref 96–112)
Creatinine, Ser: 3.29 mg/dL — ABNORMAL HIGH (ref 0.4–1.5)
GFR calc non Af Amer: 22 mL/min — ABNORMAL LOW (ref >60)
Total Bilirubin: 1.4 mg/dL — ABNORMAL HIGH (ref 0.3–1.2)

## 2010-12-19 LAB — URINALYSIS, ROUTINE W REFLEX MICROSCOPIC
Bilirubin Urine: NEGATIVE
Specific Gravity, Urine: 1.013 (ref 1.005–1.030)
Urobilinogen, UA: 0.2 mg/dL (ref 0.0–1.0)

## 2010-12-19 LAB — CULTURE, BLOOD (ROUTINE X 2)

## 2010-12-19 LAB — CBC
HCT: 26 % — ABNORMAL LOW (ref 39.0–52.0)
MCHC: 33.1 g/dL (ref 30.0–36.0)
MCV: 93.4 fL (ref 78.0–100.0)
Platelets: 363 10*3/uL (ref 150–400)
RBC: 2.78 MIL/uL — ABNORMAL LOW (ref 4.22–5.81)
WBC: 32.8 10*3/uL — ABNORMAL HIGH (ref 4.0–10.5)

## 2010-12-19 LAB — URINE MICROSCOPIC-ADD ON

## 2010-12-19 LAB — RETICULOCYTES: Retic Count, Absolute: 222.8 10*3/uL — ABNORMAL HIGH (ref 19.0–186.0)

## 2010-12-20 ENCOUNTER — Other Ambulatory Visit (HOSPITAL_COMMUNITY): Payer: Self-pay

## 2010-12-22 NOTE — H&P (Signed)
[name], [name]              ACCOUNT NO.:  [id number]  MEDICAL RECORD NO.:  [id number]           PATIENT TYPE:  I  LOCATION:  1423                         FACILITY:  [facility]  PHYSICIAN:  Calvert Cantor, M.D.     DATE OF BIRTH:  [birth year]  DATE OF ADMISSION:  12/07/2010 DATE OF DISCHARGE:                             HISTORY & PHYSICAL   PRIMARY CARE PHYSICIAN:  Eric L. August Saucer, MD  PRESENTING COMPLAINT:  Sent from Dr. Diamantina Providence office due to low hemoglobin.  HISTORY OF PRESENT ILLNESS:  This is a 35 year old male with past medical history of sickle cell disease, renal failure, hypertension and a heart murmur.  The patient has been having bilateral knee pain and lower back pain, typical of a sickle cell crisis for the past 3-4 days now.  The patient does not take any narcotics at home and has only been taking Tylenol for this.  He had some blood work done at his PCP's office and his hemoglobin was found to be low and therefore he was sent to the ER.  In the ER, his hemoglobin is noted to be 5.3.  The patient does admit being fatigued and having shortness of breath with exertion over the past 2 weeks.  He does not complain of any chest pain.  His other complaint is that his ankles have been swollen for the past 2 weeks.  He does have an echo and a stress test set up to be done by Dr. Sharyn Lull.  He never had a stress test in the past.  He states the ankle swelling is worse after day on his feet and usually improves after overnight rest.  PAST MEDICAL HISTORY: 1. Sickle cell anemia. 2. Renal failure. 3. Hypertension. 4. Heart murmur. 5. Right eye trauma resulting in blindness. 6. Small bowel obstruction versus ileus while he was on a trip in     [place].  He did not require for this.  PAST SURGICAL HISTORY: 1. Appendectomy. 2. Cholecystectomy. 3. Splenectomy.  SOCIAL HISTORY:  He does not smoke, drink or use any drugs.  He is not married.  He is a Education officer, environmental.  FAMILY HISTORY:   Diabetes, hypertension, hypercholesterolemia.  His maternal grandmother had lung cancer.  He had two aunts with a blood cancer.  He believes it was multiple myeloma.  ALLERGIES:  No known drug allergies.  HOME MEDICATIONS:  Per med rec also confirmed with the patient. 1. Uloric 40 mg daily. 2. Toprol-XL 25 mg daily. 3. Singulair 10 mg daily. 4. Multivitamin 1 tablet daily. 5. Hydroxyurea 500 mg daily. 6. Diltiazem CD 120 mg, he takes this at bedtime. 7. Folic acid, he takes 2 mg daily rather than 1. 8. Aspirin 81 mg daily. 9. Calcium 600/400 and vitamin D 1 tablet daily.  REVIEW OF SYSTEMS:  GENERAL:  He has had weight gain attributing it to the fluid in his legs.  He has been fatigued.  He has not had any fever, chills or sweats.  HEENT: No change in vision in his left eye.  No sore throat.  He has constantly runny nose.  No earache or tinnitus. RESPIRATORY:  Positive for dry cough, recent diagnosis of asthma. Please go back to past medical history and add recent diagnosis of asthma.  He does not need an inhaler.  CARDIAC:  No chest pain or palpitations.  No orthopnea, but has the pedal edema.  GI:  No nausea, vomiting, diarrhea, constipation or abdominal pain.  GU:  No dysuria or hematuria.  HEMATOLOGIC:  No easy bruising.  SKIN:  No rash. MUSCULOSKELETAL:  Has bilateral knee pain and lower back pain for the past few days.  NEUROLOGIC:  No focal numbness or weakness.  No history of strokes or seizures.  PSYCHOLOGIC:  No significant anxiety or depression.  PHYSICAL EXAM:  GENERAL:  Young male sitting up in bed, in no acute distress. VITAL SIGNS:  Blood pressure 153/84, pulse 112, respiratory rate 18, temperature 99.3, oxygen level is 83% on room air and 95% on 4 L.  On arrival, however repeat level finds it to be at 97% on room air. HEENT:  Left pupil round and reactive to light.  Conjunctivae pink.  No scleral icterus.  He is not able to open his right eye completely.   Oral mucosa is moist.  Oropharynx clear. NECK:  Supple.  No thyromegaly, lymphadenopathy or carotid bruits. HEART:  Regular rate and rhythm.  Tachycardic.  No murmurs, rubs or gallops. LUNGS:  Clear bilaterally.  Normal respiratory effort.  No use of accessory muscles. ABDOMEN:  Soft, nontender, nondistended.  Bowel sounds positive.  No organomegaly. EXTREMITIES:  No cyanosis or clubbing.  He does have edema of his feet and ankles, which is mild to moderate.  Pedal pulses are positive. NEUROLOGIC:  Cranial nerves II-XII intact.  Strength intact in all four extremities. PSYCHOLOGIC:  Awake, alert, oriented x3.  Mood and affect normal. SKIN:  Warm, dry.  No rash or bruising.  LABORATORY DATA:  Blood work WBC count is elevated at 19.2, this is not unusual for him, hemoglobin is 5.3, it seems that it runs in the low 8 range, hematocrit is 14.8, platelets 315.  Reticulocyte count is appropriately elevated at 18.6.  Metabolic panel reveals a BUN of 32 and a creatinine of 3.39, which is also not significantly changed from his baseline.  Bicarb is a little low at 17.  PT is slightly elevated at 16.  PTT is also very mildly elevated at 38. ASSESSMENT AND PLAN: 1. Anemia.  I am going to go ahead and transfuse 2 units of packed red     blood cells now.  We will recheck a hemoglobin after the     transfusion and see if he needs another unit to get him above 8.  I     will also check anemia panel to make sure he has adequate an iron,     folic acid and B12.  As noted above his anemia is symptomatic 2. Sickle cell crisis.  He has received Dilaudid in the ER, last dose     was about 1/2 hour ago, but is requesting more as it appears to     have worn off.  He is hesitant to take Vicodin or Percocet although     I explained to him that it lasts longer.  The reason for his     hesitation is that they cause itching.  I have told on that I will     order Benadryl as well for him. 3. Pedal edema.   I suspect this may be venous stasis related.  I will  order Ted's requests his feet be elevated and will also order a 2-D     echo.  I would also like to check a TSH and free T4. 4. Hypoxia, on arrival.  I am not sure if this was related to his     asthma and looks like he did get a nebulizer treatment; however, we     will to continue to monitor his O2 sats.  He will need to be on 2     liters of O2 for his sickle cell crisis and anemia anyhow. 5. Hypertension.  He is not taking his meds this morning.  I have     ordered them for him. 6. Chronic kidney disease, stage IV, this is stable. 7. Probable allergic rhinitis.  The patient would like to be a full code.  He does have a living will stating that if his illnesses appear to be untreatable, then he does not want prolonged intubation/life support.  DVT prophylaxis will be with heparin, aspirin and TED stockings.  Time on admission was 60 minutes.     Calvert Cantor, M.D.     SR/MEDQ  D:  12/07/2010  T:  12/07/2010  Job:  [id number]  cc:   Minerva Areola L. August Saucer, M.D. [address] [address] [place] [address]  Electronically Signed by Calvert Cantor M.D. on 12/22/2010 05:07:17 PM

## 2010-12-23 LAB — CBC
HCT: 21.8 % — ABNORMAL LOW (ref 39.0–52.0)
HCT: 23.5 % — ABNORMAL LOW (ref 39.0–52.0)
MCHC: 33.8 g/dL (ref 30.0–36.0)
MCV: 93.6 fL (ref 78.0–100.0)
MCV: 93.6 fL (ref 78.0–100.0)
Platelets: 332 10*3/uL (ref 150–400)
RBC: 2.51 MIL/uL — ABNORMAL LOW (ref 4.22–5.81)
RDW: 27.3 % — ABNORMAL HIGH (ref 11.5–15.5)
WBC: 17.3 10*3/uL — ABNORMAL HIGH (ref 4.0–10.5)
WBC: 25.6 10*3/uL — ABNORMAL HIGH (ref 4.0–10.5)

## 2010-12-23 LAB — COMPREHENSIVE METABOLIC PANEL
AST: 19 U/L (ref 0–37)
Albumin: 3.1 g/dL — ABNORMAL LOW (ref 3.5–5.2)
BUN: 20 mg/dL (ref 6–23)
Calcium: 8.9 mg/dL (ref 8.4–10.5)
Creatinine, Ser: 2.82 mg/dL — ABNORMAL HIGH (ref 0.4–1.5)
GFR calc Af Amer: 31 mL/min — ABNORMAL LOW (ref >60)
Total Protein: 6.2 g/dL (ref 6.0–8.3)

## 2010-12-23 LAB — DIFFERENTIAL
Band Neutrophils: 0 % (ref 0–10)
Basophils Relative: 1 % (ref 0–1)
Blasts: 0 %
Eosinophils Relative: 0 % (ref 0–5)
Lymphs Abs: 2.8 10*3/uL (ref 0.7–4.0)
Metamyelocytes Relative: 0 %
Monocytes Absolute: 1.3 10*3/uL — ABNORMAL HIGH (ref 0.1–1.0)
Neutro Abs: 21.2 10*3/uL — ABNORMAL HIGH (ref 1.7–7.7)
nRBC: 0 /100 WBC

## 2010-12-23 LAB — BASIC METABOLIC PANEL
CO2: 22 mEq/L (ref 19–32)
Chloride: 112 mEq/L (ref 96–112)
Creatinine, Ser: 3 mg/dL — ABNORMAL HIGH (ref 0.4–1.5)
GFR calc Af Amer: 29 mL/min — ABNORMAL LOW (ref >60)
Sodium: 141 mEq/L (ref 135–145)

## 2011-01-02 ENCOUNTER — Encounter: Payer: Self-pay | Admitting: Internal Medicine

## 2011-01-02 DIAGNOSIS — D571 Sickle-cell disease without crisis: Secondary | ICD-10-CM | POA: Insufficient documentation

## 2011-01-02 DIAGNOSIS — E790 Hyperuricemia without signs of inflammatory arthritis and tophaceous disease: Secondary | ICD-10-CM | POA: Insufficient documentation

## 2011-01-02 DIAGNOSIS — D5701 Hb-SS disease with acute chest syndrome: Secondary | ICD-10-CM | POA: Insufficient documentation

## 2011-01-02 DIAGNOSIS — N08 Glomerular disorders in diseases classified elsewhere: Secondary | ICD-10-CM

## 2011-01-02 LAB — DIFFERENTIAL
Basophils Relative: 0 % (ref 0–1)
Eosinophils Absolute: 0.2 10*3/uL (ref 0.0–0.7)
Eosinophils Relative: 1 % (ref 0–5)
Monocytes Absolute: 1.4 10*3/uL — ABNORMAL HIGH (ref 0.1–1.0)
Neutro Abs: 12.1 10*3/uL — ABNORMAL HIGH (ref 1.7–7.7)
Neutrophils Relative %: 72 % (ref 43–77)

## 2011-01-02 LAB — RETICULOCYTES
RBC.: 2.2 MIL/uL — ABNORMAL LOW (ref 4.22–5.81)
Retic Count, Absolute: 363 10*3/uL — ABNORMAL HIGH (ref 19.0–186.0)

## 2011-01-02 LAB — CBC
HCT: 23 % — ABNORMAL LOW (ref 39.0–52.0)
Hemoglobin: 8.1 g/dL — ABNORMAL LOW (ref 13.0–17.0)
MCHC: 35.3 g/dL (ref 30.0–36.0)
RBC: 2.21 MIL/uL — ABNORMAL LOW (ref 4.22–5.81)
RDW: 22.7 % — ABNORMAL HIGH (ref 11.5–15.5)

## 2011-01-09 LAB — CBC
MCHC: 36.3 g/dL — ABNORMAL HIGH (ref 30.0–36.0)
MCV: 102.5 fL — ABNORMAL HIGH (ref 78.0–100.0)
Platelets: 326 10*3/uL (ref 150–400)

## 2011-01-09 LAB — DIFFERENTIAL
Basophils Absolute: 0.1 10*3/uL (ref 0.0–0.1)
Eosinophils Absolute: 0.1 10*3/uL (ref 0.0–0.7)
Lymphs Abs: 2.6 10*3/uL (ref 0.7–4.0)
Monocytes Absolute: 1.7 10*3/uL — ABNORMAL HIGH (ref 0.1–1.0)
Neutrophils Relative %: 68 % (ref 43–77)

## 2011-01-09 LAB — POCT I-STAT, CHEM 8
BUN: 15 mg/dL (ref 6–23)
Calcium, Ion: 1.24 mmol/L (ref 1.12–1.32)
Creatinine, Ser: 1.6 mg/dL — ABNORMAL HIGH (ref 0.4–1.5)
Glucose, Bld: 61 mg/dL — ABNORMAL LOW (ref 70–99)
Sodium: 143 mEq/L (ref 135–145)
TCO2: 23 mmol/L (ref 0–100)

## 2011-01-09 LAB — URINALYSIS, ROUTINE W REFLEX MICROSCOPIC
Bilirubin Urine: NEGATIVE
Nitrite: NEGATIVE
Specific Gravity, Urine: 1.01 (ref 1.005–1.030)
Urobilinogen, UA: 2 mg/dL — ABNORMAL HIGH (ref 0.0–1.0)
pH: 6 (ref 5.0–8.0)

## 2011-01-09 LAB — RETICULOCYTES: Retic Ct Pct: 18.1 % — ABNORMAL HIGH (ref 0.4–3.1)

## 2011-01-09 LAB — URINE MICROSCOPIC-ADD ON

## 2011-02-15 NOTE — Discharge Summary (Signed)
[name], [name]              ACCOUNT NO.:  [id number]   MEDICAL RECORD NO.:  [id number]          PATIENT TYPE:  INP   LOCATION:  0276                         FACILITY:  [facility]   PHYSICIAN:  Eric L. August Saucer, M.D.     DATE OF BIRTH:  January 05, 1976   DATE OF ADMISSION:  12/23/2004  DATE OF DISCHARGE:  12/31/2004                                 DISCHARGE SUMMARY   FINAL DIAGNOSES:  1.  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  2.  Pneumonia.  486.  3.  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  4.  Otitis media.  282.9.   OPERATION/PROCEDURE:  Transfusion of one unit of packed red blood cells.   HISTORY OF PRESENT ILLNESS:  This is one of several [facility]  admissions for this 35 year old black male with sickle cell disease who  presented to the [facility] Emergency Room complaining of priapism.  The  patient had surgery for this priapism in the past.  He further complains of  mild limb pain.  He states that this was manageable.  The patient had  complained of palpitations with known history of cardiac arrhythmia as well.  There had been no fever, vomiting, or chills.  The patient denies any  chronic incontinence.  The patient had severe pain for which he presented to  the emergency room.  He was treated with Dilaudid.  The pain, however, was  not completely resolved.  His episodes of priapism persisted.  He was  subsequently admitted for further evaluation and therapy.   PAST MEDICAL HISTORY AND PHYSICAL EXAMINATION:  Per admission history and  physical.   HOSPITAL COURSE:  The patient was admitted with acute sickle cell crisis  manifested mainly by priapism but also associated pain.  He was started on  IV fluids.  He was placed on Dilaudid IV q.3h. with Phenergan as well.  With  this, he did have some gradual decrease in his episode of priapism.  He was  seen, however, in consultation by Dr. Patsi Sears of urology.  It was felt at  that time that he was beginning to resolve his priapism and,  therefore,  conservative measures were continued.   With hydration and continued therapy, the patient did make steady  improvement with his priapism.  He was noted, however, to have a low-grade  fever.  Pulmonary exam showed changes at both bases consistent with early  pneumonia.  This was confirmed by chest x-ray showing air space disease  bilaterally.  The patient was started on incentive spirometry as well as  nebulizer therapy.  He was continued on IV antibiotics.  His IV fluids were  adjusted downward with close followup for possible acute chest syndrome  which the patient has had in the past.   His pulmonary status required several days to eventually stabilize.  Notably  his episodes of priapism did gradually subside.  He was given a trial of  terbutaline per Dr. Brunilda Payor for use on an as-needed basis for his priapism  which did help as well.   On March 31, his hemoglobin was noted to drop to 7.8.  As he was still  having episodes of priapism, he was transfused one unit of packed red blood  cells which he tolerated.  The patient continued to have intermittent bouts  which eventually subsided.  Eventually his diffuse arthralgias resolved as  well.  His pulmonary status improved with antibiotic therapy and incentive  spirometry as noted above.  By December 31, 2004, he was doing considerably  better.  He had no significant episodes of priapism.  He felt his pain was  manageable at home.  He was subsequently discharged home on the following  medications.   DISCHARGE MEDICATIONS:  1.  Folate 2 mg daily.  2.  __________ 10 mg daily.  3.  Hydrea 500 mg b.i.d.  4.  Mucinex BM 600 mg b.i.d.  5.  Toprol-XL 25 mg half tablet b.i.d.  6.  Phental 400 mg t.i.d.  7.  Avelox 400 mg daily.  8.  Loratadine 10 mg daily.  9.  Atrovent inhaler two puffs q.i.d.  10. Astelin nasal spray one to two puffs b.i.d. p.r.n.  11. Vicodin 1-2 mg q.4h. p.r.n. severe pain.  12. Auralgan ear drops as needed  q.i.d.  13. He will also use incentive spirometry every two hours.   FOLLOW UP:  The patient will follow up in the office in two weeks.      ELD/MEDQ  D:  02/06/2005  T:  02/07/2005  Job:  [id number]

## 2011-02-15 NOTE — Discharge Summary (Signed)
NAME:  [name], SEMINARA NO.:  [id number]   MEDICAL RECORD NO.:  [id number]                    PATIENT TYPE:   LOCATION:                                       FACILITY:   PHYSICIAN:  Eric L. August Saucer, M.D.                  DATE OF BIRTH:   DATE OF ADMISSION:  04/02/2002  DATE OF DISCHARGE:  04/14/2002                                 DISCHARGE SUMMARY   FINAL DIAGNOSES:  1. 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  2. Pneumonia, organism nonspecified, 486.  3. Congestive heart failure, 428.0.  4. Cardiac dysrhythmia, 427.89.  5. Allergic rhinitis, 477.9.  6. 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   OPERATIONS/PROCEDURES:  None.   HISTORY OF PRESENT ILLNESS:  This is the second recent [facility]  admission for this 35 year old single black male with hemoglobin SS disease  who had been doing well until the Monday prior to admission.  During that  time he noted the onset of increasing left-sided chest discomfort.  There  was no sharp pain.  This was more dull in nature with increasing pain on  inspiration.  He denied significant diaphoresis.  The patient's symptoms  progressed as the days went on.  He subsequently came to the emergency room  at approximately 8:30 p.m.  He was noted by x-ray to have a left-sided  infiltrate, with EKG showing no acute ischemic changes.  He did have an  elevated white count and was admitted for further evaluation.   PAST MEDICAL HISTORY, PHYSICAL EXAM:  Per admission H&P.   HOSPITAL COURSE:  The patient was admitted for further evaluation of acute  sickle cell crisis.  The chest x-ray at the time of admission was not as  well aerated but was noted to have persistent bibasilar infiltrates.  This  was consistent with ongoing pneumonia as well.  He was treated with IV  fluids initially, with IV vancomycin in lieu of his most recent methicillin-  resistant infection.  He was noted also to have intermittent  supraventricular tachycardia  at the time of presentation.  He was  subsequently placed on low-dose Lopressor for this as well.  Over the  ensuing days the patient's sickle cell pain gradually decreased.  He,  however, had persistent problems with pulmonary infiltrates and hypoxemia.  A CT scan was done of the chest which did demonstrate bilateral lower lobe  consolidation as well as mid lung air space disease.  No one particular  organism could be identified on this occasion.  Notably, he also underwent  intensive pulmonary therapy with nebulizers as necessary.   On April 06, 2002, the patient was noted to have six beats of ventricular  tachycardia.  At that time he was noted to have a low potassium as well at  3.4 with transient hypoxemia.  He was started on low-dose beta blockers.  A  2-D echocardiogram was  done.  This demonstrated normal left ventricular  function.  Ejection fraction ranged between 55% to 65%.  No significant wall  motion abnormalities were detected.  The left atrium was mildly dilated.   The patient was continued on his intensive pulmonary regimen.  He had no  significant arrhythmias once his electrolytes had corrected.  He did have  problems with intermittent priapism during his hospital stay, but this also  improved as his crisis resolved.  After 10 days of therapy with vancomycin,  this was stopped.  He was started on Zithromax thereafter.  He remained  afebrile.  X-ray continued to show slow resolution of his bilateral  pneumonia.  Clinically, however, he was doing just a little bit better.  By  April 14, 2002, he was felt to be stable for discharge.   DISCHARGE MEDICATIONS:  1. Hydroxyurea 500 mg p.o. q.d.  2. Vioxx 12.5 mg q.d.  3. Lopressor 25 mg b.i.d.  4. Trental 400 mg t.i.d.  5. Claritin 10 mg q.d.  6. Zithromax 250 mg q.d. as directed.  7. Folate 2 mg q.d.  8. Tiazac 180 mg q.d.  9. Advair 100/50 1 puff b.i.d.  10.      Robitussin elixir 2 teaspoons q.i.d.  11.      Percocet 1-2  p.o. q.4h. p.r.n. pain.   DISCHARGE INSTRUCTIONS:  He is to avoid caffeine.  He is to use incentive  spirometer every two hours while awake.   FOLLOW-UP:  Call for an appointment in two weeks time.                                                Eric L. August Saucer, M.D.    ELD/MEDQ  D:  06/16/2002  T:  06/16/2002  Job:  [id number]

## 2011-02-15 NOTE — H&P (Signed)
[facility]  Patient:    [name], [name] Visit Number: [id number] MRN: [id number]          Service Type: MED Location: [id number] Attending Physician:  Gwenyth Bender Dictated by:   Lind Guest. August Saucer, M.D. Admit Date:  05/29/2001                           History and Physical  CHIEF COMPLAINT: Acute weakness, rigors, with sickle cell crisis.  HISTORY OF PRESENT ILLNESS: This is one of several [facility] admissions for this 35 year old single black male, with hemoglobin SS disease, who presented to the emergency room after developing a sudden rigor and increasing arthralgias.  He states he had been feeling well until approximately five days ago and at that time he developed increasing pain consistent with a crisis.  He came to the emergency room but was not seen at that time.  He contacted Dr. Shana Chute and was given Vicodin for control of his pain.  He rested at home and drank liquids.  He felt he was making progress until Wednesday.  The patient went outside and played golf in the changing weather and the subsequent day he developed increasing weakness.  This morning he had a sudden severe shaking chill.  He thought he was having seizures at that time.  He also developed increasing arthralgias and came to the emergency room for evaluation.  He was noted to be in sickle cell crisis.  A chest x-ray demonstrated a right lower lobe pneumonia.  The patient was subsequently admitted for further evaluation.  PAST MEDICAL HISTORY:  1. Multiple bouts of admissions for sickle cell crisis.  2. He has had two episodes of acute chest syndrome with severe pulmonary     disease and decompensation in the past.  3. 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  4. He has had problems with sinuses, with recurrent sinus infection as well.  ALLERGIES: None known.  CURRENT MEDICATIONS:  1. Hydroxyurea 400 mg q.d.  2. Vicon Forte one p.o. q.d.  3.  Folate 2 mg q.d.  4. Claritin 10 mg p.o. q.d.  SOCIAL HISTORY: The patient does not smoke or drink.  The patient is a Optician, dispensing.  REVIEW OF SYSTEMS: As noted above.  He has had recurrent problems with noncompliance.  PHYSICAL EXAMINATION:  GENERAL: Well-developed, ill-appearing black male.  VITAL SIGNS: Height 5 feet 7 inches.  Weight 140.6 pounds.  Initial temperature 100.4 degrees, blood pressure 110/49, pulse 93, respiratory rate 18.  HEENT: Head normocephalic, atraumatic.  Previously sclerosed right eye.  Left eye nonicteric and reactive to light.  No sinus tenderness.  Mild turbinate edema.  TMs with decreased light reflex.  Membranes dry.  Post pharynx clear.  NECK: Supple.  No enlarged thyroid.  He has small left posterior cervical nodes to palpation, nontender.  LUNGS: Markedly diminished breath sounds in right base with E-A changes.  He has dry crackles in the left base.  No wheezes or rales appreciated.  CARDIOVASCULAR: Normal S1 and S2.  No S3.  No S4.  A 1/6 systolic ejection murmur is noted at the lower left sternal border.  ABDOMEN: Bowel sounds present.  No enlarged liver.  Presently without epigastric tenderness.  MUSCULOSKELETAL: Mild tenderness in lower lumbosacral spine.  Presently without significant joint pain in the knees, shoulders, wrists.  NEUROLOGIC: Alert and oriented x 3.  Cranial nerves intact.  Nonfocal.  SKIN: Without active lesions.  LABORATORY DATA: CBC revealed a WBC of 27,000, hemoglobin 9.8, hematocrit 27.4; platelets 205,000; neutrophils 85%; absolute granulocytes elevated at 23.5%.  Chemistries show sodium 141, potassium 3.9, chloride 105, CO2 25, BUN 8, creatinine 0.9, glucose 103.  Urinalysis shows 0-2 wbc/hpf, rare squamous, 1+ protein.  Chest x-ray demonstrated right lower lobe infiltrate with volume loss.  IMPRESSION:  1. Acute sickle cell crisis.  2. Right lower lobe pneumonia.  3. Rule out sepsis with marked leukocytosis,  rigors, and fever.  4. History of allergic rhinitis.  5. Past history of acute chest syndrome.  PLAN: The patient is admitted for further therapy of his sickle cell crisis and pneumonia.  He will be placed on IV Tequin and supplemental O2.  Sputum cultures will be obtained.  Will follow closely to rule out acute chest syndrome versus recurrent pneumonia.  Further therapy pending response to above.Dictated by:   Lind Guest. August Saucer, M.D. Attending Physician:  Gwenyth Bender DD:  05/29/01 TD:  05/30/01 Job: [id number]

## 2011-02-15 NOTE — H&P (Signed)
NAME:  [name], [name]                        ACCOUNT NO.:  [id number]   MEDICAL RECORD NO.:  [id number]                   PATIENT TYPE:  INP   LOCATION:  0276                                 FACILITY:  [facility]   PHYSICIAN:  Eric L. August Saucer, M.D.                  DATE OF BIRTH:  05/28/1976   DATE OF ADMISSION:  11/12/2002  DATE OF DISCHARGE:                                HISTORY & PHYSICAL   CHIEF COMPLAINT:  Sickle cell crisis.   HISTORY OF PRESENT ILLNESS:  This is one of several [facility]  admissions for this 35 year old married black male with hemoglobin SS  disease who presents complaining of increasing weakness with associated  cough and penile pain.  He states he had been feeling well until  approximately one week ago.  He noted increasing postnasal drainage with  sinus congestion.  This was associated with cough productive of yellow to  greenish phlegm.  He denied fever or chills.  No actual chest pains.  During  this time, he noted progressive problems with erectile pain.  He had been  receiving exchange transfusions on a monthly basis for this with some  control.  Today he notes his symptoms are much more severe.  He subsequently  was seen in the office for evaluation.  The patient is being admitted  thereafter.   PAST MEDICAL HISTORY:  Notable for several admissions for sickle cell crisis  complicated by acute chest syndrome and bouts of bronchitis and pneumonia.  The patient suffers from allergic rhinitis with intermittent sinusitis, as  well.  Some evidence of restrictive lung disease, as well.   SURGERIES:  Status post splenectomy approximately 20 years ago.   HABITS:  The patient denies smoking and drinking.  This, however, is  questionable with acknowledging some cigar smoke at times.   ALLERGIES:  No known allergies.   PRESENTING PROBLEM:  1. Folate 800 mcg two daily.  2. Vicon Forte one daily.  3. Hydroxyurea 400 mg daily.  4. Trental 400 mg  t.i.d. (has been on Pletal in the past, as well).  5. Vioxx 25 mg daily.  6. Metoprolol 50 mg half tablet b.i.d.  7. Claritin 10 mg p.o. daily.   PHYSICAL EXAMINATION:  GENERAL:  He is a well-developed, well-nourished  black male in mild distress.  VITAL SIGNS:  Blood pressure of 130/56, pulse 81, respiratory rate 20,  temperature 97.6, height 5 feet 7 inches, weight 153 pounds.  HEENT:  Head normocephalic and atraumatic.  Old trauma to the right eye.  He  has scleral icterus noted to the left eye.  There is right frontal sinus  tenderness.  Mild turbinate edema, right greater than left.  TMs with  decreased light reflex bilaterally.  NECK:  Supple.  Small positive cervical nodes on right versus left.  Posterior pharyngeal erythema.  LUNGS:  With decreased breath sounds of the right  basis versus the left.  No  wheezes or rales.  Mildly decreased E:A changes in the right base, as well.  No CVA tenderness.  CARDIOVASCULAR:  Normal S1 and S2.  No S3, S4, murmurs, or rubs.  ABDOMEN:  Without tenderness.  MUSCULOSKELETAL:  Full range of motion in all extremities.  Negative Homans.  No edema.  GU:  Notable for normal male with bilaterally descended testes.  Presently  without erection.  SKIN:  Notable for scaliness on the soles of the feet.  He also has some  dry, lichenified lesions on his arms bilaterally.   LABORATORY DATA:  CBC reveals a WBC of 31,500, hemoglobin 11.5, hematocrit  30.6.  MCV of 101.3.  Chemistries with a sodium of 139, potassium 4.2,  chloride 108, CO2 26, BUN 9, creatinine 0.8, glucose 97.  SGOT 41, SGPT 22.  Bilirubin elevated at 4.1.  Calcium 9.2.  Retic count high at 13.5.   IMPRESSION:  1. Sickle cell crisis.  2. Priapism, recurrent, secondary to #1.  3. Sinusitis with mild bronchitis, rule out other.  4. Mild volume depletion.  5. Questionable history of tobacco use.   PLAN:  Will plan exchange transfusion.  Start IV fluids.  Control pain  acutely with  Dilaudid or morphine sulfate.  Empiric antibiotics with Avelox.  Plan a chest x-ray PA and lateral in the a.m.  Further therapy pending  results of the above.                                               Eric L. August Saucer, M.D.    ELD/MEDQ  D:  11/12/2002  T:  11/12/2002  Job:  [id number]

## 2011-02-15 NOTE — H&P (Signed)
[facility]  Patient:    [name], [name] Visit Number: [id number] MRN: [id number]          Service Type: MED Location: [id number] Attending Physician:  Gwenyth Bender Dictated by:   Lind Guest. August Saucer, M.D. Admit Date:  12/07/2001                           History and Physical  CHIEF COMPLAINT:  Sickle cell crisis.  HISTORY OF PRESENT ILLNESS:  This is one of several [facility] admissions for this 35 year old single black male with hemoglobin SS disease who states he was doing well until early this a.m.  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  He noted early the morning of admission that he had increasing pain in the knees with mild swelling.  The patient notably did not have any analgesia at that time.  His pain progressed and he came to [facility] emergency room for admission.  The patient was noted to be in sickle cell crisis and admitted at this time.  He denies significant chest pains or shortness of breath.  He has had some sinus congestion, a rare nonproductive cough.  No documented fevers.  States he had been taking his medications as directed.  Had not been consuming adequate fluids, however, over the past few weeks.  No other specific concerns at this time.  REVIEW OF SYSTEMS:  As noted above.  PAST MEDICAL HISTORY:  As per old records.  Notably he has had history of intermittent medication noncompliance.  ALLERGIES:  No known allergies.  PRESENT MEDICATIONS: 1. Folate 2 mg q.d. 2. Hydroxyurea 400 mg q.d. 3. Vicon Forte one q.d. 4. Claritin 10 mg q.d. 5. Penicillin 500 mg sporadically.  PHYSICAL EXAMINATION:  GENERAL:  He is a well-developed, well-nourished black male in mild distress. He rates his pain at 8/10 at maximum.  VITAL SIGNS:  Blood pressure 115/69, pulse 68, respiratory rate 20, temperature 98.1.  Height 5 feet 7 inches, weight 143  pounds.  HEENT:  Head is normocephalic, atraumatic.  There is no sinus tenderness.  No scleral icterus.  TMs are clear.  Nose with mild turbinate edema.  Throat and posterior pharynx clear.  NECK:  Supple.  No enlarged thyroid.  No posterior cervical nodes.  LUNGS:  Notable for decreased breath sounds, left base greater than right. Bilateral E to A changes.  No rub, no CVA tenderness.  No wheezes appreciated.  CARDIOVASCULAR:  Normal S1, S2.  No S3, S4, murmurs, or rubs.  ABDOMEN:  Bowel sounds are present.  No enlarged liver or spleen, masses, or tenderness.  GENITOURINARY:  Normal male.  MUSCULOSKELETAL:  There is tenderness in the knees, left knee greater than right.  Mild crepitation with passive range of motion.  Minimal increased warmth.  Negative Homans, no edema.  NEUROLOGIC:  Intact.  LABORATORY DATA:  CBC revealed WBC 18,800; hemoglobin 9.7; hematocrit 25.6. Electrolytes revealed sodium 134, potassium 4.1, chloride 110, CO2 24, BUN 8, creatinine 1.0, glucose 100.  Total bilirubin elevated at 3.2.  Alkaline phosphatase 100, SGOT/SGPT within normal limits.  Chest x-ray demonstrated cardiac enlargement with early vascular congestion. No definite infiltrate noted.  Urinalysis with pH 6, specific gravity 1.015, trace protein, leukocytes negative.  IMPRESSION: 1. Sickle cell crisis. 2. 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 3. Rule out acute bronchitis versus other. 4. History of noncompliance.  PLAN:  Admit for 24-hour observation.  Will continue IV fluids, analgesia parenterally and p.o. as tolerated.  P.o. Zithromax used empirically at this time.  Will continue folate.  Supplemental O2.  Reassess the patients progress over the subsequent 24 hours with further therapy thereafter. Dictated by:   Lind Guest. August Saucer, M.D. Attending Physician:  Gwenyth Bender DD:  12/07/01 TD:  12/08/01 Job: [id number]

## 2011-02-15 NOTE — H&P (Signed)
[name]              ACCOUNT NO.:  [id number]   MEDICAL RECORD NO.:  [id number]          PATIENT TYPE:  INP   LOCATION:  0254                         FACILITY:  [facility]   PHYSICIAN:  Eric L. August Saucer, M.D.     DATE OF BIRTH:  [birth year]   DATE OF ADMISSION:  09/11/2004  DATE OF DISCHARGE:                                HISTORY & PHYSICAL   CHIEF COMPLAINT:  Sickle cell crisis.   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  He states he  has been fairly active over the past week.  He had been under some increased  stress as well.  He noted the onset of increasing lower back discomfort.  He  increased his fluids while at home.  Approximately eight hours later, he was  awakened with severe pain in his lower back, severe knee pain, and priapism.  The patient went to [facility] where he was treated acutely.  Despite this, he did not resolve his crisis.  He was subsequently admitted  for further evaluation.   The patient denies recent fever, chills, night sweats, or cough.  Denies  alcohol, tobacco, or street drug use.  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  States, however, that he had been  getting adequate rest.   REVIEW OF SYMPTOMS:  A 15-point review of systems otherwise unremarkable.  He had only recently had an increasing problem with priapism with this  associated crisis.   ALLERGIES:  The patient has no known allergies.   MEDICATIONS:  1.  Toprol 12.5 mg p.o. q.d.  2.  Hydroxyurea 500 mg p.o. b.i.d.  3.  Singulair 10 mg q.d.  4.  Trental 400 mg p.o. t.i.d.  5.  He also has been taking a multivitamin as well.   PAST MEDICAL HISTORY:  Otherwise notable for being status post splenectomy  in 1994.  Traumatic injury to the right eye in the past.  He has had  several  admissions for sickle cell crisis associated with acute chest syndrome with  bouts of bronchitis, pneumonia, and sinusitis.   SOCIAL HISTORY:  The patient recently divorced.  Presently works as a  Optician, dispensing.   PHYSICAL EXAMINATION:  GENERAL:  He is a well-developed, well-nourished  black male initially in severe distress.  VITAL SIGNS:  Reveal blood pressure initially of 158/67, pulse of 101,  respiratory rate 20, temperature 100.3.  O2 saturation 93% on one liter.  Height 67 inches.  Weight 150 pounds.  HEENT:  Normocephalic and atraumatic.  Extraocular movements intact.  Noted  scleral icterus.  Old trauma to right eye.  There is no sinus tenderness.  Nose shows mild turbinate edema without occlusion.  TMs without erythematous  changes with decreased light reflex.  NECK:  Supple.  No posterior cervical nodes.  Throat shows posterior  pharyngeal erythema.  No enlarged thyroid.  No carotid bruits.  LUNGS:  Clear to  auscultation and percussion without CVA tenderness.  No E  to A changes appreciated.  No rubs.  CARDIOVASCULAR:  Rapid rhythm.  Normal S1 and S2.  No S3 or S4.  Presently  without murmur or rub.  ABDOMEN:  Without tenderness.  No masses appreciated.  MUSCULOSKELETAL:  Tenderness in the lower lumbar sacral spine.  Mild  tenderness in the knees bilaterally.  No increased warmth appreciated or  crepitation.  No effusions noted.  Negative Homans'.  No edema.  SKIN:  Without active lesions.  NEUROLOGICAL:  Intact.   LABORATORY DATA:  CBC reveals a WBC of 25,300, hemoglobin 9.8, hematocrit  26.6, platelets 470,000.  There is 83 polys.  Chemistries show sodium 138,  potassium 3.9, chloride 106, CO2 24, BUN 8, creatinine 0.9, glucose of 91.  SGOT 51, SGPT 20, alkaline phosphate 95, total bilirubin elevated at 3.4.  cholesterol questionably elevated at 518.  Calcium 9.0.  Reticulocyte count  elevated at 12.4.   Chest x-ray demonstrates mild cardiomegaly, chronic  pulmonary interstitial  prominence noted.  No evidence for acute infiltrates or effusions.  No  evidence for congestive heart failure.   IMPRESSION:  1.  Sickle cell crisis.  2.  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  3.  Allergic rhinitis.  4.  Situational stress.  5.  Sickle cell lung disease.   PLAN:  The patient is admitted for intensive treatment at this time.  We  will continue IV hydration with parenteral IV Dilaudid as needed.  We will  monitor his response over the next 24 hours and consider exchange  transfusion should his priapism recur.  We will screen for colon infection.  With history of acute chest syndrome, we will use incentive spirometry with  close monitoring of fluids and O2 status.  Further therapy pending response  to the above.  We will add Avelox empirically as well.  Follow up  thereafter.      ELD/MEDQ  D:  09/11/2004  T:  09/11/2004  Job:  [id number]

## 2011-02-15 NOTE — H&P (Signed)
[facility]  Patient:    [name], PACI Visit Number: [id number] MRN: [id number]          Service Type: MED Location: 2S [id number] [id number] Attending Physician:  Gwenyth Bender Dictated by:   Lind Guest. August Saucer, M.D. Admit Date:  03/16/2002                           History and Physical  CHIEF COMPLAINT:  Persistently painful erections/sickle cell crisis.  HISTORY OF PRESENT ILLNESS:  This is one of several [facility] admissions for this 35 year old single black male who presents complaining of increasing painful erections, i.e. priaprism, for the past three weeks.  He notes that this occurs whenever he falls asleep.  He awakens with severe painful erections which are difficult to tolerate.  He does require pain medication for this.  The patient most recently had a severe episode for which he went to the [facility] emergency room.  He was seen by urology at that time. The penis area was injected with saline after extracting blood.  This, however, did afford some transient relief.  The patient denies chest pains or shortness of breath.  Occasional arthralgias but not severe.  No fevers, chills, or night sweats.  He has been drinking liquids.  Denies any other acute distress.  History is significant for him having intermittent episodes of priapism in the past.  He most recently had responded well to Trental, presently at 400 t.i.d. No other new complaints.  His past medical history is well documented in old records.  HABITS:  The patient does not smoke or drink.  SOCIAL HISTORY:  The patient is a Optician, dispensing.  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  ALLERGIES:  No known allergies.  PRESENT MEDICATIONS: 1. Hydroxyurea 400 mg p.o. q.d. 2. Vicodin forte one q.d. 3. Folic acid 2 mg q.d. 4. Claritin 1 mg q.d.  OTHER HISTORY:  His history is also significant for intermittent bouts of acute chest syndrome during his previous admissions for sickle cell  crisis.  PHYSICAL EXAMINATION:  GENERAL:  He is a well-developed well-nourished black male in no acute distress at this time.  VITAL SIGNS:  Height 5 feet 7 inches, weight 145.4 pounds.  Blood pressure 130/73, pulse 102, respiratory rate 16, temperature 98.1.  HEENT:  Head is normocephalic, atraumatic.  He has scarring of the right eye. The left eye with mild scleral icterus.  There is no sinus tenderness.  Mild turbinate edema bilaterally without occlusions.  TMs are clear.  NECK:  No posterior cervical nodes.  No enlarged thyroid.  LUNGS:  Few basilar rhonchi on the left, negative on the right.  No E to A changes.  CARDIOVASCULAR:  Normal S1, S2.  No S3.  He has a soft 1/6 systolic ejection murmur in the left lower sternal border.  No AI murmur.  No rub.  ABDOMEN:  Bowel sounds are present.  No masses or tenderness.  GENITOURINARY:  Normal circumcised male.  He had mild induration in the shaft of the penis.  No increased warmth.  No testicular masses.  MUSCULOSKELETAL:  Full range of motion.  No joint tenderness at this time. Negative Homans, no edema.  LABORATORY DATA:  CBC reveals WBC 16,200; hemoglobin 10.2; hematocrit 27.1; 325,000 platelets.  Other laboratory data pending at this time.  IMPRESSION: 1. Sickle cell anemia, low grade crisis. 2. Persistent painful erections, i.e. priaprism, secondary to sickle    vasculitis.  This has been refractive to medication intervention. 3. Allergic rhinitis. 4. History of acute chest syndrome. 5. Episodic bronchitis.  PLAN:  Will admit for IV hydration and fluids.  Will continue Trental 400 t.i.d.  Pain medication as necessary.  In view of recurrent priaprism will arranged for exchanged transfusions.  The patient notably has poor access sites at this time, will need a PICC line.  Further therapy thereafter with probable urologic intervention if this does not resolve his underlying problem. Dictated by:   Lind Guest. August Saucer,  M.D. Attending Physician:  Gwenyth Bender DD:  03/16/02 TD:  03/17/02 Job: [id number]

## 2011-02-15 NOTE — H&P (Signed)
[facility]  Patient:    [name], [name] Visit Number: [id number] MRN: [id number]          Service Type: MED Location: 3W [id number] Attending Physician:  Gwenyth Bender Dictated by:   Lind Guest. August Saucer, M.D. Admit Date:  04/01/2002                           History and Physical  CHIEF COMPLAINT:  Left-sided chest pain with sickle cell crisis.  HISTORY OF PRESENT ILLNESS:  This is the second recent [facility] admission for this 35 year old single black male with hemoglobin SS disease, who had been doing relatively well until Monday prior to admission.  The patient noted the onset of increasing left-sided discomfort.  He denies sharp pain.  He notes this was somewhat dull in nature with some increased pain with inspiration.  There was no change in associated coughing.  There was no associated diaphoresis.  His symptoms persisted as the day progressed.  The patient subsequently came to the emergency room at approximately 8:30 p.m.  He was noted to have persistent left-sided infiltrate.  EKG showed no acute ischemic changes.  The patient was noted to have elevated white count and was admitted for further evaluation.  History is significant for ______ and most recently admitted to [facility] for sickle cell crisis.  This was complicated by a MRSA infection involving both lung fields.  There was also a question of acute chest syndrome as well.  He was treated, however, with vancomycin in the hospital with defervescence of fever as well as white count.  He was discharged home as well on IV vancomycin which he states he has been taking twice a day.  Of note, he has been resting mainly in bed.  He was previously advised to be up and with pulmonary toilet at home.  The patient does not smoke or drink.  He states that eating habits have otherwise been good.  PAST MEDICAL HISTORY:  As previously documented.  He had had a longstanding history of  previous noncompliance but has been taking his medications on a more regular basis.  He does have a history of allergic rhinitis with associated sinus infection as well.  The patient also, during his last hospitalization, had significant problems with priapism.  This required exchange transfusion with subsequent improvement.  The patient had a PICC line placed during his last hospital stay.  He has not noted any significant tenderness or signs of infection.  REVIEW OF SYSTEMS:  Otherwise unremarkable.  ALLERGIES:  The patient has no known allergies.  MEDICATIONS: 1. Vicon Forte 1 p.o. q.d. 2. Hydroxyurea 400 mg q.d. 3. Bextra 20 mg q.d. 4. Claritin 10 mg q.d. 5. Trental 400 mg t.i.d., though apparently he had taken once a day. 6. Folate 2 mg p.o. q.d.  PHYSICAL EXAMINATION:  GENERAL:  Well-developed, anxious, black male in mild distress.  VITAL SIGNS:  Blood pressure 159/70 initially, pulse 92-120, respiratory rate 18, temperature 97.4, O2 saturation 90% on room air initially.  HEENT:  Head normocephalic, atraumatic.  Right eye with scleral icterus and previously scarred.  Left eye with good extraocular movements.  There is no sinus tenderness.  TMs with diffuse eye reflex bilaterally.  Throat and posterior pharynx clear.  NECK:  Nodes with no shotty.  Posterior cervical nodes bilaterally nontender.  LUNGS:  Markedly diminished breath sounds in the left base with a rub.  He has  got EDA changes in the left base versus right.  Decreased breath sounds on the right side as well.  Minimal left CVA tenderness.  CARDIOVASCULAR:  Rapid rhythm.  Normal S1, S2, no S3.  No rub.  A 1/6 systolic ejection murmur noted on the left sternal border.  ABDOMEN:  Bowel sounds are present.  No tenderness appreciated.  EXTREMITIES:  Minimal cyanosis to the nail beds with early spooning.  There is mild crepitation in the right knee.  No edema, negative Homans.  NEUROLOGIC:  The patient is  alert and oriented x 3.  Exam nonfocal.  He is anxious at this time.  LABORATORY DATA:  CBC demonstrates a WBC of 21,200, hemoglobin 9.9, hematocrit 28.2.  Left shift noted on the initial fields.  Chemistry:  Sodium 142, potassium 4.2, chloride 110, CO2 27, BUN 7, creatinine .9, glucose 89.  SGOT 38, SGPT 43, alkaline phosphatase 127, total bilirubin elevated at 1.9.  Chest x-ray showed that the lung fields were not as well aerated as compared to previous film on March 24, 2002.  He has persistent bibasilar opacities. Unclear if this area was secondary to atelectasis versus residual pneumonia.  IMPRESSION: 1. Left-sided chest pain secondary to pneumonia and pleural effusion.  Pain    presently had musculoskeletal features. 2. Rule out apical angina. 3. Sickle cell anemia with crisis. 4. Cardiac arrhythmia with new onset supraventricular tachycardia. 5. Mild cardiomegaly of the x-ray, rule out early sickle cell cardiomyopathy. 6. Status post methicillin-resistant Staphylococcus aureus infection with    persistence. 7. Allergic rhinitis. 8. History of priapism, presently quiescent.  PLAN: 1. Treat the patient for sickle cell crisis and crisis acutely with O2    support, analgesia, and low-rate IV fluids. 2. Continue IV vancomycin at this time. 3. Will start patient on Lopressor at 25 mg p.o. b.i.d. to control his    tachycardia.  Maintain adequate O2 support. 4. Incentive spirometer. 5. Follow up on electrolytes and CBC in the a.m.  Further therapy thereafter. Dictated by:   Lind Guest. August Saucer, M.D. Attending Physician:  Gwenyth Bender DD:  04/02/02 TD:  04/05/02 Job: [id number]

## 2011-02-15 NOTE — Discharge Summary (Signed)
[name], [name]              ACCOUNT NO.:  [id number]   MEDICAL RECORD NO.:  [id number]          PATIENT TYPE:  INP   LOCATION:  0254                         FACILITY:  [facility]   PHYSICIAN:  Eric L. August Saucer, M.D.     DATE OF BIRTH:  1976-07-23   DATE OF ADMISSION:  09/11/2004  DATE OF DISCHARGE:  09/14/2004                                 DISCHARGE SUMMARY   FINAL DIAGNOSES:  1.  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  2.  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  3.  Allergic rhinitis, 477.9.  4.  Acute stress reaction, 308.3.  5.  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   OPERATION AND PROCEDURES:  Transfusion.   HISTORY OF PRESENT ILLNESS:  This is one of several [facility]  admissions for this 35 year old divorced black male with hemoglobin SS  disease who was doing well until approximately 24 hours prior to admission.  He states he had been fairly active over the past week prior to admission.  He had been under increased stress as well.  The patient noted an onset of  increasing lower back discomfort.  He increased his fluids while at home.  Eight hours later, however, he awakened with severe pain in his lower back,  knee pain and priapism.  The patient went to [facility] where he  was treated acutely.  Despite this, he did not resolve his crisis, and was  subsequently admitted for further evaluation.   PAST MEDICAL HISTORY:  Per admission H&P.   PHYSICAL EXAMINATION:  Per admission H&P.   HOSPITAL COURSE:  The patient was admitted for further treatment of acute  sickle cell crisis.  This was associated with bouts of priapism as well.  The patient was given IV hydration with D5 1/4 normal saline.  He was placed  on IV Dilaudid with Phenergan q.3h. as well.  The patient was also placed on  incentive spirometry as well as low-flow O2 as he has had a history of acute  chest syndrome in the past.  On the subsequent day, the patient was feeling  much better.  He had only minimum  episodes of priapism.  He did not have, at  one point, pain in his legs and back as well.  The patient was noted to have  O2 sats decreasing during the night.  A repeat hemoglobin showed a value of  7.6 which had decreased.  The patient was transfused with 1 unit of packed  RBCs.  He was started on incentive spirometry intensively.  Over the  subsequent days, he gradually felt better.  The frequency of his IV pain  medicine was slowly tapered.  By September 14, 2004, he had made considerable  progress.  I felt that he could manage his pain regimen with oral medicines  at home.  His progress had improved considerably as well.  The patient was  subsequently discharged home, much improved.  He was maintained on oral  antibiotics.   DISCHARGE MEDICATIONS:  1.  Hydrea 500 mg b.i.d.  2.  Trental 400 mg t.i.d.  3.  Avelox 100 mg daily for 7  days.  4.  Folate 2 mg daily.  5.  Lopressor 25 mg b.i.d.  6.  Singulair 2 mg daily.  7.  Multivitamins daily.  8.  He has been given Vicodin 5/500 1-2 q.4h. p.r.n. pain.   DISCHARGE INSTRUCTIONS:  He was instructed to rest at home until his crisis  resolves.  He is to avoid caffeine.  The patient is to continue incentive  spirometry every 2 hours at home.      ELD/MEDQ  D:  10/17/2004  T:  10/17/2004  Job:  [id number]

## 2011-02-15 NOTE — Op Note (Signed)
NAME:  [name], [name]                        ACCOUNT NO.:  [id number]   MEDICAL RECORD NO.:  [id number]                   PATIENT TYPE:  INP   LOCATION:  0276                                 FACILITY:  [facility]   PHYSICIAN:  Eric L. August Saucer, M.D.                  DATE OF BIRTH:  [birth year]   DATE OF PROCEDURE:  DATE OF DISCHARGE:  11/16/2002                                 OPERATIVE REPORT   FINAL DIAGNOSES:  1. Sickle cell crisis, 282.62.  2. Hypovolemia, 276.5.  3. 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   OPERATIONS AND PROCEDURES:  Exchange transfusion x 1.   HISTORY OF PRESENT ILLNESS:  This was one of several [facility]  admissions for this 35 year old, married, black male, with hemoglobin S-S  disease, who presented complaining of increasing weakness with associated  cough and penile pain.  He states he had been feeling well until  approximately one week prior to admission.  He noted increasing postnasal  drainage with sinus congestion.  This was associated with cough productive  of yellow to greenish phlegm.  He denied fever or chills.  No actual chest  pains.  During this time, he noted progressive problems with erectile pain.  He had been receiving exchange transfusions on monthly basis for this with  some control.  On the day of admission, he noted that his symptoms were much  more severe.  The patient subsequently came to the office for evaluation and  was admitted thereafter.   PAST MEDICAL HISTORY:  1. Notable for several admissions for sickle cell crisis, complicated by the     acute chest syndrome and bouts of bronchitis and pneumonia.  2. The patient also suffers from allergic rhinitis with intermittent     sinusitis as well.  3. Some evidence of restrictive lung disease as well.   PHYSICAL EXAMINATION:  Per admission H&P.   HOSPITAL COURSE:  The patient was admitted for further treatment of his  sickle cell crisis.  This had been manifesting mainly as recurring bouts of  priapism.  He also had signs of a sinusitis with mild bronchitis.  There was  some evidence of volume depletion as well.  The patient was placed on IV  fluids for hydration.  He underwent an exchange transfusion which he  tolerated well.  His pain was controlled acutely with Dilaudid.  He was also  placed on Avalox, empirically antibiotics.   Over the subsequent 24 hours, he made gradual improvement.  He continued,  however, to have significant episodes of penile pain which very gradually  decreased.  Notably, his lungs did improve with antibiotics and pulmonary  therapy.  Despite his improvement, however, he continued to have  intermittent episodes of priapism.  Due to the refractory nature of this, he  was seen in consultation by Lindaann Slough, M.D. of nephrology.  It was  recommended that he be given a trial of  amyl nitrite.  This was done in a  hospitalized setting with the patient getting prompt relief of his priapism.  This occurred after 3-4 inhalations.  The patient did well thereafter.  It  was elected to give him a trial of amyl nitrite as an outpatient with follow-  up per Lindaann Slough, M.D.  By 02/17, he was feeling much better, minimal  pain.  It was felt that he was stable for discharge.   MEDICATIONS AT TIME OF DISCHARGE:  1. MS Contin 30 mg p.o. q.12h.  2. Avalox 100 mg daily.  3. Amyl nitrite p.r.n. priapism.'  4. Folate 800 mcg 2 p.o. daily.  5. Vicon Forte 1 daily.  6. Hydroxyurea 400 mg daily.  7. Trental 400 mg t.i.d.  8. Vioxx 25 mg daily.  9. Metoprolol 50 mg 1/2 tab b.i.d.  10.      Claritin 10 mg p.o. daily.   FOLLOW UP:  The patient will be seen in the office in two weeks' time for  follow-up.                                               Eric L. August Saucer, M.D.    ELD/MEDQ  D:  12/29/2002  T:  12/29/2002  Job:  [id number]

## 2011-02-15 NOTE — Discharge Summary (Signed)
[facility]  Patient:    [name], [name] Visit Number: [id number] MRN: [id number]          Service Type: MED Location: [id number] Attending Physician:  Gwenyth Bender Dictated by:   Lind Guest. August Saucer, M.D. Admit Date:  05/29/2001 Discharge Date: 06/02/2001                             Discharge Summary  FINAL DIAGNOSES: 1. Pneumonia, organism nonspecified, 486. 2. Sickle cell crisis, 282.62.  OPERATIONS AND PROCEDURES:  None.  HISTORY OF PRESENT ILLNESS:  This was one of several 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  He noted he had been feeling well until approximately five days ago when he developed increasing joint pains consistent with crisis.  He was treated in the emergency room at that time and was released.  He rested at home with Vicodin for pain and drank plenty of liquids.  He felt as if he was making progress until two days prior to admission.  He notably went playing golf and had sudden change in weather during that time and noted increasing weakness.  The subsequent morning the patient developed severe shaking chill.  He subsequently progressed further with increasing arthralgias and crisis.  He presented to the emergency room again for evaluation.  Chest x-ray demonstrated a right lower lobe pneumonia with the patient subsequently being admitted thereafter.  PAST MEDICAL HISTORY:  As per admission H&P.  PHYSICAL EXAMINATION:  As per admission H&P.  HOSPITAL COURSE:  The patient was admitted for further treatment of acute sickle cell crisis with chest x-ray demonstrating right lower lobe pneumonia. He was placed on low flow oxygen as well as IV fluids.  He was started on Tequin IV 400 mg q.d.  He was placed on Dilaudid for control of his pain acutely. Over the  subsequent days, the patients pain did gradually defervesce.  His temperature curve also defervesced.  He was therefore switched to Zithromax orally.  Notably a follow-up chest x-ray demonstrated initial worsening of his infiltrates.  The patient, however, demonstrated improving oxygenation.  Over the subsequent days of therapy, his joint pains did gradually subside.  His hemoglobin dropped as low as 7.6 but then began rising again to 8.2 the subsequent day and 8.4 thereafter.  No transfusion was required.  The patients oxygenation eventually improved with the addition of Advair inhaler.  Peak flows gradually improved as well.  His hydroxyurea was restarted on June 02, 2001.  By that time he was feeling much better and ambulatory.  His pain was significantly improved.  The patient was felt to be stable for discharge for further outpatient management thereafter.  MEDICATIONS AT TIME OF DISCHARGE: 1. Zithromax 250 mg q.d. for an additional four days. 2. Advair 100/50 one puff b.i.d. 3. Claritin 10 mg q.d. 4. Folate 2 mg q.d. 5. Hydrea 500 mg q.d. 6. Percocet one to two p.o. q.4h. p.r.n. pain.  ACTIVITY:  As tolerated.  DIET:  Regular.  FOLLOW-UP:  He is to call the office for follow-up appointment within the next weeks time.  He will need a follow-up chest x-ray as well. Dictated by:   Lind Guest. August Saucer, M.D. Attending Physician:  Gwenyth Bender DD:  07/15/01 TD:  07/15/01 Job: [id number]

## 2011-02-15 NOTE — Consult Note (Signed)
[facility]  Patient:    [name], [name] Visit Number: [id number] MRN: [id number]          Service Type: EMS Location: ED Attending Physician:  Donnetta Hutching Dictated by:   Barron Alvine, M.D. Admit Date:  03/14/2002 Discharge Date: 03/14/2002                            Consultation Report  ADDENDUM TO RECENT ER CONSULTATION  He was seen in the emergency room, I did a consultation, and then there were some other issues.  SUMMARY:  [name] had presented with a three to four week history of intermittent priapism.  We had recently evaluated him in the ER and at that time he had had about 50% detumescence and had responded to some oxygen and hydration.  We talked to him about the possibility of obtaining a blood gas and doing corporal irrigations.  The patient had refused those recommendations.  He subsequently had increase in his erection to approximately 75% and we went back to reevaluate him.  We discussed with the patient some of the issues and spent about 30 minutes going over the complications and treatment options for priapism.  He did not have a full priapism, but did apparently have this on and off over the last several weeks which is not surprising given his history.  He had received 1500 cc of fluid and some oxygen.  We felt it best to go ahead and sample the blood within the corpus cylinder and do some mild irrigation.  For that reason the patient was prepped and draped in the usual manner.  We discussed with him some of the pros and cons of doing this approach and full informed consent was obtained. I performed the standard penile block with 1% lidocaine.  A blood gas was then drawn on his penis.  Eventually that came back borderline.  It did not show frank ischemia, but was far from normal.  His pH was approximately 7.2 with a pCO2 of approximately 46 and a pO2 level of 67.  We were able to extract about 50 cc of moderately dark blood.   It was not as dark and ischemic as I have seen in other cases of priapism, but it was certainly not bright red blood either.  We then irrigated his corpora with some sterile injectable saline.  I then used approximately 2 cc of phenylephrine.  This was used in a standard concentration a little bit at a time.  The patient was monitored.  His blood pressures did not change.  After completion of all of these procedures his erection had decreased to approximately 25%.  The patient on examination seems to have some thickening and some diffuse fibrosis of his corporal cylinders. He has, again, quite a bit of ventral curvature.  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  He is told that he needs to keep ice on the penis.  He will need to keep up with his hydration, be sure that he avoids alcohol which he denies using.  He will need to discuss things with Dr. August Saucer tomorrow with regard to possibility of exchange transfusion and if he has recurrent priapism I think he would be best served at an academic center and I explained to him that my experience with penile shunting is very  limited and that we would want to get consultation at [facility] or [place] for him. Dictated by:   Barron Alvine, M.D. Attending Physician:  Donnetta Hutching DD:  03/14/02 TD:  03/16/02 Job: [id number]

## 2011-02-15 NOTE — Discharge Summary (Signed)
NAME:  [name], [name]                        ACCOUNT NO.:  [id number]   MEDICAL RECORD NO.:  [id number]                   PATIENT TYPE:  INP   LOCATION:  0481                                 FACILITY:  [facility]   PHYSICIAN:  Eric L. August Saucer, M.D.                  DATE OF BIRTH:  September 09, 1976   DATE OF ADMISSION:  07/22/2002  DATE OF DISCHARGE:  07/28/2002                                 DISCHARGE SUMMARY   FINAL DIAGNOSES:  1. Sickle cell crisis, 282.62.  2. 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  3. Constipation, 564.00.   OPERATIONS AND PROCEDURES:  Exchange transfusion of packed red blood cells.   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  The patient presented to the emergency room acutely.  He was  treated with IV fluids and analgesia.  His pain persisted, although with  some improvement.  Because of persistence, he was subsequently admitted for  further evaluation.   PAST MEDICAL HISTORY:  Per admission H&P.   PHYSICAL EXAMINATION:  Per admission H&P.   HOSPITAL COURSE:  The patient was admitted for further treatment of sickle  cell crisis.  He was placed on IV fluids.  He was started on Dilaudid q.2-  3h. for control of pain.  He was also placed on antibiotics empirically as  well. The initial 24 hours the patient had significant problems with painful  erections, i.e., priapism.  He related that this had been happening fairly  frequently prior to admission.  Notably, a repeat hemoglobin at that time  was 10.5, with a white count of 22,000.  He subsequently underwent an  exchange transfusion the subsequent day which improved the priapism  significantly.  The patient thereafter was continued on supportive measures  for his sickle cell crisis.  He had significant pain in the left foot.  X-  ray of the foot  was unremarkable, however, with persistence of the pain, he  did undergo a MRI scan which showed changes associated with sickle cell  disease.   The patient continued to make good progress thereafter.  Notably, a chest x-  ray obtained earlier showed no evidence of active infection.  He was noted  to have some mild cardiomegaly.  By 07/28/02, he was thought to be stable  for discharge.  The patient was fairly ambulatory with only mild degree of  pain.   DISCHARGE MEDICATIONS:  1. Lopressor 25 mg b.i.d.  2. Vioxx 25 mg q.d.  3. Claritin 10 mg q.d.  4. Folate 2 mg q.d.  5. Trental 400 mg t.i.d.  6. Hydroxyurea 500 mg q.d.  7. He will be maintained on Vicodin one or two tablets q.4h. p.r.n. pain.   ACTIVITY:  He is to avoid  prolonged standing.   DIET:  He is to monitor his sweets and caffeine in his diet.   FOLLOWUP:  He is to call for an appointment to be seen in one weeks' time.                                               Eric L. August Saucer, M.D.    ELD/MEDQ  D:  09/08/2002  T:  09/09/2002  Job:  [id number]

## 2011-02-15 NOTE — Consult Note (Signed)
[name], [name]              ACCOUNT NO.:  [id number]   MEDICAL RECORD NO.:  [id number]          PATIENT TYPE:  INP   LOCATION:  0276                         FACILITY:  [facility]   PHYSICIAN:  Sigmund I. Patsi Sears, M.D.DATE OF BIRTH:  1976/07/09   DATE OF CONSULTATION:  DATE OF DISCHARGE:                                   CONSULTATION   SUBJECTIVE:  [name] is a 35 year old single African-American male with a  history of hemoglobin SS disease and recurrent priapism.  Patient has had  multiple stuttering episodes with a history of priapism in the December  admission, treated conservatively with oxygen and pain medication.  On  September 11, 2004, the patient was treated with hydroxyurea, Trental,  Avelox, Dilaudid.  His priapism spontaneously abated at that time.  He now  reports hemoglobin SS crisis since 4 a.m. with (?) length of priapic  episode.  The patient was noted to have pain in his penis at approximately  noon in the emergency room and treated with Dilaudid.  He is now seen with  decrease in rigidity of the penile priapism.   PAST MEDICAL HISTORY:  1.  Splenectomy in 1994.  2.  Right eye trauma.  3.  Bronchitis.  4.  Pneumonia.   ALLERGIES:  None known.   REVIEW OF SYSTEMS:  Patient denies fever, chills, back pain, or penile pain.   SOCIAL HISTORY:  The patient was married, but this was annuled.  He is  currently a Optician, dispensing.   PHYSICAL EXAMINATION:  VITAL SIGNS:  Temperature 99.2, blood pressure  139/84, respiratory rate 20, heart rate 109.  O2 sat 85%, rising to 91%.  GENERAL:  Physical examination today shows a thin, well-developed black male  asleep.  ABDOMEN:  This shows a scaphoid abdomen, positive bowel sounds without  organomegaly without masses.  GENITOURINARY:  The testicles are descended bilaterally.  The penis is  partially priapic.  It is nonpainful to palpation.   Laboratories show O2 sat 91%, white blood cells 22,300, hemoglobin 8.9,  platelet count  216,000.  Multiple sickle cells are seen on smear.  Urinalysis shows 1+ protein, otherwise negative.  Serum sodium 141,  potassium 4.4, chloride 100, CO2 25, BUN 11, creatinine 0.9.  Urine tox  screen positive for Valium and opiates.   ASSESSMENT:  Hemoglobin SS crisis with partial priapism.  The patient also  has a history of stuttering episodes.  He will be treated with oxygen,  monitoring.  While this appears to be a low-flow state, it appears that it  is resolving priapism.      SIT/MEDQ  D:  12/23/2004  T:  12/23/2004  Job:  [id number]

## 2011-02-15 NOTE — Consult Note (Signed)
   NAME:  [name], [name]                        ACCOUNT NO.:  [id number]   MEDICAL RECORD NO.:  [id number]                   PATIENT TYPE:  EMS   LOCATION:  ED                                   FACILITY:  [facility]   PHYSICIAN:  Lindaann Slough, M.D.               DATE OF BIRTH:  01-Jul-1976   DATE OF CONSULTATION:  05/09/2003  DATE OF DISCHARGE:                                   CONSULTATION   REASON FOR CONSULTATION:  Priapism.   HISTORY OF PRESENT ILLNESS:  The patient is a 35 year old male with history  of sickle disease who has a history of recurrent priapism.  He had a shunt  done about 3 months ago; however, every now and then he continues to have  recurrent priapism.  He was given a prescription for amyl nitrite; however,  he did not use it this morning and he came to the emergency room with  priapism.  He was given IV fluid and pain medication; however, he still  continued to have priapism.  I was asked to come in and see him.   PHYSICAL EXAMINATION:  VITAL SIGNS:  Blood pressure 150/88, pulse 95,  respirations 18, temperature 97.4.  GENERAL:  At the time of my visit, the priapism had come down and he did not  have any erection.  The penis was flaccid, although he has corporal  fibrosis.  The incision on the glans penis is healed.  Scrotum is  unremarkable.  Both testes, cords, and epididymis are within normal limits.   IMPRESSION:  Recurrent priapism, status post Al-Ghorab shunt.   PLAN:  Use amyl nitrite p.r.n. and I also gave him a prescription for Ambien  10 mg #20 one  h.s. p.r.n.                                                 Lindaann Slough, M.D.    MN/MEDQ  D:  05/09/2003  T:  05/09/2003  Job:  [id number]

## 2011-02-15 NOTE — H&P (Signed)
[name], [name] NO.:  [id number]   MEDICAL RECORD NO.:  [id number]                   PATIENT TYPE:  INP   LOCATION:  0277                                 FACILITY:  [facility]   PHYSICIAN:  Eric L. August Saucer, M.D.                  DATE OF BIRTH:  [birth year]   DATE OF ADMISSION:  06/14/2002  DATE OF DISCHARGE:                                HISTORY & PHYSICAL   CHIEF COMPLAINT:  1. Sickle cell.  2. Recurrent priapism with painful erections.   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  He has had previous  bouts of this which most recently did respond to exchange transfusions.  He  has been also treated with Pletal most recently.  Over the past several  weeks he has noted increasing long bouts of painful erections.  This is  worse when he takes in caffeine or sweets.  He has been drinking adequate  fluids on a regular basis.  Denies chest pain or shortness of breath.  Occasional mild arthralgias.  He has been taking his medication otherwise as  noted.  No other new complaints.  His symptoms progressed here recently to  the point that he desires further intervention.   PAST MEDICAL HISTORY:  1. Sickle cell lung disease with recurrent bouts of pneumonia.  2. Mild residual restrictive lung component.  3. Otherwise per medical records.   SOCIAL HISTORY:  The patient is single.  He does not smoke or drink.   ALLERGIES:  No known drug allergies.   REVIEW OF SYMPTOMS:  As noted above.  He does have intermittent sinus  problems as well.   PRESENT MEDICATIONS:  1. Folate 2 mg p.o. q.d.  2. Pletal 100 mg p.o. b.i.d.  3. Vicodin 5 mg q.4h. p.r.n. severe pain.  4. Allegra 180 mg p.o. q.d.   PHYSICAL EXAMINATION:  GENERAL:  He is a well-developed, well-nourished  black male in no acute  distress.  VITAL SIGNS:  Blood pressure is 110/70, pulse is 88, respiratory rate 18,  afebrile.  HEENT:  Head is normocephalic.  Chronic scarring of the right cornea.  Left  with mild scleral icterus.  There is no sinus tenderness.  Mild turbinate  edema bilaterally.  Tympanic membranes are clear.  Throat and oropharynx  clear.  NECK:  Supple.  He has small posterior cervical nodes on the right,  nontender, negative on the left.  LUNGS:  Presently are clear without wheezes or rales.  No E-to-A changes.  No CVA tenderness.  CARDIOVASCULAR:  He has normal S1 and S2, no S3.  He has a 1/6 systolic  ejection murmur at the second left intercostal space.  No rub appreciated.  ABDOMEN:  Without tenderness.  MUSCULOSKELETAL:  Without tenderness.  Full passive range of motion.  Minimal crepitus in the knees.  Negative Homan's, no edema.  GENITOURINARY:  Normal circumcised male.  Bilaterally descending testes.  No  nodules appreciated.  SKIN:  Without active lesions.  NEUROLOGIC:  Intact.   LABORATORY DATA:  Pending at this time.   IMPRESSION:  1. Sickle cell anemia with mild crisis.  2. 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  3. Allergic rhinitis.  4. History of recurrent pneumonias.  5. Restrictive lung disease.   PLAN:  1. We will admit the patient for further treatment.  2. We will obtain a CBC.  3. Type and cross match for one unit with exchange transfusion thereafter.  4. Low dose intravenous fluids.  5. Current analgesia orally p.r.n. pain.  6. Further therapy depending on response above.                                               Eric L. August Saucer, M.D.    ELD/MEDQ  D:  06/14/2002  T:  06/15/2002  Job:  [id number]

## 2011-02-15 NOTE — Discharge Summary (Signed)
[name], [name] NO.:  [id number]   MEDICAL RECORD NO.:  [id number]                   PATIENT TYPE:  INP   LOCATION:  0277                                 FACILITY:  [facility]   PHYSICIAN:  Eric L. August Saucer, M.D.                  DATE OF BIRTH:  11-19-1975   DATE OF ADMISSION:  06/14/2002  DATE OF DISCHARGE:  06/15/2002                                 DISCHARGE SUMMARY   FINAL DIAGNOSES:  1. 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  2. __________, 607.3.  3. Other diseases of the lung, 518.89.  4. Allergic rhinitis, 477.9.  5. History of respiratory system disease, V12.6.   PROCEDURE:  Exchanged transfusion of 1 unit of packed red blood cells.   HISTORY OF PRESENT ILLNESS:  This is one of several [facility]  admissions for this 35 year old single black male with hemoglobin S disease  who has been having increasing problems with painful erections.  He has had  previous bouts of this which most recently did respond to exchange  transfusion.  He has also been treated with Pletal as well.  Over the past  several weeks he has noted increasingly longer bouts of painful erections.  This is worse when he takes in caffeine or sweets.  He has been drinking  adequate fluids on a regular basis.  Denies chest pain or shortness of  breath.  He has been taking his medication as noted.  Because of progressive  of symptoms, he was admitted for further evaluation and therapy.   PAST MEDICAL HISTORY:  Per admission H&P.   PHYSICAL EXAMINATION:  Per admission H&P.   HOSPITAL COURSE:  The patient was admitted for further treatment of his  __________ which was a manifestation of his sickle cell anemia with crisis.  He was given IV fluids.  He did undergo an exchange transfusion as well.  He  was given parenteral analgesia for control of pain.  After 24 hours, he did  note significant improvement of his symptoms.  Notably, his hemoglobin  remained greater  then 10 at the time of the exchange transfusion.  Post  transfusion it was 10.5.  Examination was otherwise unremarkable.  He was  felt to be stable for discharge.  He was discharged home subsequently  improved.   DISCHARGE MEDICATIONS:  1. Folate 2 mg q.d.  2. Vicon Forte one q.d.  3. Hydroxyurea 400 mg q.d.  4. Pletal 400 mg b.i.d.  5. Cardizem 120 mg q.d.  6. Vioxx 12.5 mg q.d.  7. He may use Vicodin 5 mg q.4h. p.r.n. pain.   Avoid any caffeine.   FOLLOWUP:  Follow up in our office in two weeks time.  Eric L. August Saucer, M.D.    ELD/MEDQ  D:  07/15/2002  T:  07/15/2002  Job:  [id number]

## 2011-02-15 NOTE — Discharge Summary (Signed)
[name], [name]                          ACCOUNT NO.:  [id number]   MEDICAL RECORD NO.:  [id number]                   PATIENT TYPE:  INP   LOCATION:  0252                                 FACILITY:  [facility]   PHYSICIAN:  Eric L. August Saucer, M.D.                  DATE OF BIRTH:  07/10/1976   DATE OF ADMISSION:  03/16/2002  DATE OF DISCHARGE:  03/26/2002                                 DISCHARGE SUMMARY   FINAL DIAGNOSES:  1. 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  2. Pneumonia due to Staphylococcus aureus, 482.41.  3. 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  4. 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   OPERATION/PROCEDURE:  Venous catheter insertion on March 17, 2002, per Dr.  Fredia Sorrow.   HISTORY OF PRESENT ILLNESS:  This was one of several [facility]  admissions for this 35 -year-old single black male who presented complaining  of increasing painful erections; i.e. priapism, for the past three weeks.  The patient notes that this occurs whenever he falls asleep.  He awakens  with severe painful erections which are difficult to tolerate.  He has used  strong pain medication for this without significant relief.  He most  recently had a severe episode for which he went to [facility].  He was seen by urology at that time.  The penis area was injected  with saline after extracting blood.  This, however, did afford some  transient relief.  He denies presently chest pain or shortness of breath.  Occasional arthralgias but not severe.  No fevers, chills, or night sweats.  He has been drinking some liquids.  He denies any other acute distress.   PAST MEDICAL HISTORY:  As per admission H&P.   PHYSICAL EXAMINATION:  As per admission H&P.   HOSPITAL COURSE:  The patient was admitted for further treatment of sickle  cell crisis with associated complication of priapism.  He was initially  started on IV fluids.  He was placed on low flow oxygen as well due  to  saturations of 88% on room air.  He was given Dilaudid for control of his  pain acutely.  Within the first 24 hours, the patient spiked a temperature  of 102.3.  Blood pressure and vital signs otherwise were negative.  Repeat  pulmonary exam showed marked basilar rhonchi with E to A changes in the  right base greater than the left.  He was started on a combination of  Zithromax and Tequin thereafter.  Blood and sputum cultures were ordered.   Over the subsequent days, the patient continued to have spiking fevers  initially.  He also had a gradual decrease in congestion associated with  pneumonia.  He did have ongoing hypoxemia, however.  Sputum culture  eventually returned positive for MRSA infection.  He was thereafter started  on vancomycin per pharmacy protocol.   Over the  subsequent days, he did make gradual and steady improvement.  Notably as his crisis subsided, his priapism did decrease as well.  By March 23, 2002, his pain was significantly improved.  He was subsequently started  on oral Tylox which he tolerated reasonably well.  Supportive measures were  continued thereafter with him experiencing one further episode of priapism.   Follow-up chest x-ray showed gradual improvement of the right lower lobe  infiltrate, though it was still present.  Vancomycin was continued in the  hospital until March 25, 2002.  It was felt that he would need continued  therapy for approximately two weeks' time with reevaluation thereafter.   The patient was subsequently discharged home on March 26, 2002.   DISCHARGE MEDICATIONS:  1. Vancomycin 1.5 g IV q.12h. for 10 days.  2. Folate 2 mg q.d.  3. Hydroxyurea 400 mg q.d.  4. Vicon Forte one p.o. q.d.  5. Bextra 20 mg q.d.  6. Tylox one to two q.4h. p.r.n. pain.  7. Trental 400 mg p.o. t.i.d.   He was sent home with a PICC line to be followed by [facility].   FOLLOW UP:  He will have follow-up appointment in the office in one  week's  time.  He will need serial CBCs and CMETs as well.  He will need a follow-up  chest x-ray in two weeks' time.                                                Eric L. August Saucer, M.D.    ELD/MEDQ  D:  05/05/2002  T:  05/10/2002  Job:  [id number]

## 2011-02-15 NOTE — Consult Note (Signed)
   NAME:  [name], [name]                        ACCOUNT NO.:  [id number]   MEDICAL RECORD NO.:  [id number]                   PATIENT TYPE:  INP   LOCATION:  0276                                 FACILITY:  [facility]   PHYSICIAN:  Lindaann Slough, M.D.               DATE OF BIRTH:  [birth year]   DATE OF CONSULTATION:  11/15/2002  DATE OF DISCHARGE:                                   CONSULTATION   REASON FOR CONSULTATION:  Painful erections.   HISTORY OF PRESENT ILLNESS:  The patient is a 35 year old male known to have  sickle cell disease who has been having intermittent painful erections for  about a year now.  He states that his erections are painful and last between  15 minutes to an hour.  He also has spontaneous erections without pain.  He  has been receiving exchange transfusion every month with relief of those  symptoms.   He has no voiding symptoms.  He has no frequency, hesitancy, dysuria, or  straining on urination.   MEDICATIONS:  1. Hydroxyurea 400 mg p.o. daily.  2. Folate 2 mg p.o. daily.  3. Vioxx 25 mg p.o. daily.  4. Pletal 100 mg p.o. twice a day.  5. Claritin 10 mg p.o. daily.  6. Metoprolol 50 mg p.o. twice a day.   PHYSICAL EXAMINATION:  GENITOURINARY:  His penis is circumcised.  Meatus is  normal.  Penis is firm but not erect at this time.  Scrotum is unremarkable.  Testicles, cords, and epididymis are within normal limits.   LABORATORY DATA:  Hemoglobin 10.6, hematocrit 29.2, and WBC 20.1.  BUN is 9,  creatinine 0.8.   IMPRESSION:  Sickle cell disease and intermittent priapism.   PLAN:  I have discussed treatment options with the patient:  1. Amyl nitrite.  2. Aspiration of corpora cavernosa and cavernosa spongiosum shunt.   He would like to try amyl nitrite first, as he is recently married and also  has spontaneous erections.  I have told him that if he had a shunt it is  more likely he would be able to have spontaneous erections.  So we will try  the  amyl nitrite and see if he responds.  If it does not, we will then  discuss treatment regarding shunt.                                               Lindaann Slough, M.D.    MN/MEDQ  D:  11/15/2002  T:  11/15/2002  Job:  [id number]   cc:   Minerva Areola L. August Saucer, M.D.  [address] [address]  [place]  [place] [address]  Fax: [phone]

## 2011-02-15 NOTE — Discharge Summary (Signed)
[name]  Patient:    [name], [name] Visit Number: [id number] MRN: [id number]          Service Type: MED Location: [phone] Attending Physician:  Gwenyth Bender Dictated by:   Lind Guest. August Saucer, M.D. Admit Date:  05/29/2001 Discharge Date: 06/02/2001                             Discharge Summary  FINAL DIAGNOSES: 1. Pneumonia, 486. 2. 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  OPERATIONS AND PROCEDURES:  None.  HISTORY OF PRESENT ILLNESS:  This is one of several [facility] admissions for this 35 year old single black male with hemoglobin SS disease who presented to the emergency room after developing sudden rigor and increasing arthralgias.  He states he had been feeling well until 5 days prior to admission when he developed increasing pain consistent with crisis.  The patient came to the emergency room, but was not seen at that time.  The patient contacted Dr. Shana Chute, he was given Vicodin for control of his acute pain until the day of visit in the office.  The patient went outside to play golf in the change of weather, and on the subsequent day he developed increasing weakness.  On the morning of admission, he had a sudden severe shaking chill.  He also developed increasing arthralgias.  The patient subsequently went to the emergency room with acute sickle cell crisis.  He was admitted for further evaluation.  Chest x-ray notably demonstrated a right lower lobe pneumonia.  PAST MEDICAL HISTORY AND PHYSICAL EXAMINATION:  Per admission H&P.  HOSPITAL COURSE:  The patient was admitted for further treatment of acute sickle cell crisis and right lower lobe pneumonia.  He was noted to have some hypoxemia as well.  The patient was placed on low flow oxygen.  He was placed on IV Tequin on a daily basis as well.  He was given parenteral Dilaudid for control of his acute pain.  Over the subsequent days, the patient made a  slow, but steady improvement. Temperature maximum was 100.5.  This occurred on Tequin.  He was subsequently placed on Zithromax.  IV fluids were tapered as well, as he was noted to have more congestion in his lungs.  The patient was switched over to Tylox for acute pain control.  With continued measurements, his breathing did gradually improve.  There was no associated chest pain or stomach pains as well.  The patients hemoglobin gradually began to rise after ongoing supportive measures.  No other acute episodes occurred.  By 06/02/01, he was considerably better.  He was felt to be stable for discharge.  DISCHARGE MEDICATIONS: 1. Zithromax 250 mg q.d. as directed. 2. Advair punch/50 one puff b.i.d. 3. Claritin 10 mg q.d. 4. Folate 2 mg q.d. 5. Hydrea 500 mg p.o. q.d.  The patient is to continue his fluid intake.  He is to call the office in two weeks time for followup. Dictated by:   Lind Guest. August Saucer, M.D. Attending Physician:  Gwenyth Bender DD:  08/12/01 TD:  08/12/01 Job: [id number]

## 2011-02-15 NOTE — Discharge Summary (Signed)
[facility]  Patient:    [name], [name] Visit Number: [id number] MRN: [id number]          Service Type: MED Location: 2S [id number] Attending Physician:  Gwenyth Bender Dictated by:   Lind Guest. August Saucer, M.D. Admit Date:  12/07/2001 Discharge Date: 12/15/2001                             Discharge Summary  FINAL DIAGNOSES: 1. 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 2. Pneumonia, 486. 3. Pulmonary collapse, 518.0. 4. Anemia, 285.9. 5. Leukocytosis, 288.8.  OPERATIONS/PROCEDURES:        Transfusion of packed RBCs.  HISTORY OF PRESENT ILLNESS:   This is one of several [facility] admissions for this 35 year old single black male with hemoglobin SS disease. He states he was doing well until early the a.m. of the admission.  He states he had been on speed a great deal one day prior to admission.  He noted early morning of admission that he had had increasing pain in the knees with mild swelling.  The patient notably did not have any analgesia at that time.  His pain progressed and he came to [facility] for evaluation and treatment.  He was noted to be in sickle cell crisis thereafter and he was admitted.  He denies significant chest pain or shortness of breath.  He has had occasional sinus congestion with rare nonproductive cough.  No documented fevers.  He states he had otherwise been taking his medication as directed.  The past medical history and physical examination are per admission H&P.  HOSPITAL COURSE:  The patient was admitted for further treatment of sickle cell crisis.  He was noted, at the time of admission, to have evidence for cardiac enlargement with early vascular congestion.  At that time, no definite infiltrate was noted.  There was, however, a clinical concern for acute bronchitis.  He was placed on IV fluids initially with IV Dilaudid for control of his pain.  In lieu of his previous history of recurring acute  chest syndrome, he was placed on Zithromax empirically as well.  He was maintained on folate as well as supplemental O2.  On the subsequent days, the patient continued to have significant bouts of pain.  This, however, with supportive measures, did gradually taper.  A followup chest x-ray did demonstrate evidence for bibasilar pneumonia after adequate hydration.  The patient was continued on his antibiotics.  He was noted to have a low grade fever with this with a maximum eventually reaching to 102.0.  The patient did require pulmonary toilet with nebulizers for further treatment.  His cough was productive, however, no definite organisms were found on culture.  Eventually, with supportive measures, supplemental oxygen, and antibiotics, the patients symptoms did gradually subside.  It was felt that his picture was most consistent with an acute chest syndrome as well.  While hospitalized, on March 13, the patient had an episode of severe priapism.  He was started on Trental at 400 mg p.o. t.i.d.  This did result in relatively prompt improvement of his symptoms thereafter.  This medication was maintained.  With ongoing supportive measures, the patients joint pains did gradually improve.  He reported transient stabbing headaches during this time as well. 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  With ongoing supportive measures, this eventually subsided as well.  By December 15, 2001, he was felt to be  stable for discharge.  DISCHARGE MEDICATIONS: 1. Hydrea 400 mg p.o. q.d. 2. Folate 2 mg q.d. 3. Trental 400 mg t.i.d. 4. Clarinex 5 mg q.d. 5. Advair 100/50 one puff b.i.d. 6. Percocet 1-2 p.o. q.4h. p.r.n. pain. 7. Vicon Forte 1 p.o. q.d.  He will use incentive spirometry q.i.d.  He is to be seen in the office in two weeks time for followup. Dictated by:   Lind Guest. August Saucer, M.D. Attending Physician:  Gwenyth Bender DD:  01/11/02 TD:  01/12/02 Job: [id number]

## 2011-02-15 NOTE — Consult Note (Signed)
[facility]  Patient:    [name], [name] Visit Number: [id number] MRN: [id number]          Service Type: EMS Location: ED Attending Physician:  Donnetta Hutching Dictated by:   Barron Alvine, M.D. Admit Date:  03/14/2002 Discharge Date: 03/14/2002                            Consultation Report  EMERGENCY ROOM CONSULTATION  CHIEF COMPLAINT:  Intermittent painful and prolonged erections.  HISTORY OF PRESENT ILLNESS:  The patient is a 35 year old African-American male.  He has sickle cell anemia and has been hospitalized and seen in the emergency room in the past for painful crises.  He is a patient of Dr. Willey Blade.  It is unclear what his normal percentage of sickle hemoglobin is.  It does appear he has had exchange transfusions in the past.  For several weeks now he seems to be having intermittent bouts of priapism.  He apparently has discussed this with Dr. August Saucer, according to the patient.  He states that this seems to occur at night when he is sleeping.  He wakes up with a painful erection and, therefore, he is uncertain as to the duration.  It does appear that these tend to detumess over several hours, and then the pain resolves. The patient has otherwise had no erectile dysfunction.  He states that he has had significant ventral curvature of his penis as long as he can remember. Apparently, this morning he had a fairly significant erection but was really unable to tell me the exact duration of time.  It had started to ______ , and the pain was resolving when he presented to the emergency room.  Again, he reports that this has been an on and off problem for several weeks now.  The patient states that he was going to be seeing Dr. August Saucer on Monday, although the exact plan was unclear to me and whether that was going to entail urology consultation or exchange transfusion.  When we saw the patient in the emergency room he stated his erection had reduced  approximately 50%.  He was no longer having pain.  We had recommended oxygen and hydration on the way into the emergency room.  PAST MEDICAL HISTORY:  Again, is primarily significant for sickle cell anemia.  SOCIAL HISTORY:  He is a nonsmoker and denies any substance abuse.  MEDICATIONS:  He is on folic acid and Claritin.  ALLERGIES:  None.  PHYSICAL EXAMINATION:  GENERAL:  Well-developed, well-nourished male.  VITAL SIGNS:  Afebrile.  Vital signs within normal limits.  The nursing staff tells me that when he initially presented to the emergency room he was laughing and talking on his cell telephone, and they had a difficult time getting some intake information from him because he was busy on the cell telephone and appeared to be pain-free at that time.  GENITOURINARY:  On exam he has approximately 50% tumescence of his penis. There is, again, about 40 degrees to 50 degrees of ventral curvature.  There is no obvious tenderness.  It appears that he has had at least significant degree of detumescence since what he described earlier today.  ASSESSMENT:  Intermittent episodes of priapism.  I explained to the patient that sickle disease does result in priapism.  The etiology is usually sludging of sickle hemoglobin within the corporal cylinders.  He was told that this is an ischemic form of priapism and,  if this continues or persists, that he can have significant fibrosis of the corporal cylinders which can result in ongoing problems with erectile dysfunction in the future.  He is currently reasonably detumesced.  He is told that the standard of care in someone who has a prolonged erection is to obtain a blood gas to document ischemia and then to do some corporal washings with saline to get rid of the sludge to sickle blood and then potentially injecting some type of agent within the penis to constrict the blood vessels and try to decrease the priapism.  He is told that these are  treatments, however, that generally treat the priapism at that time and do not necessarily result in any long-term solution.  I did recommend that we go ahead and consider irrigating this to some degree but, since his erection had decreased to 50%, it was unclear whether that was really necessary since the erection seemed to be resolving on its own.  In addition, the patient really refused any type of procedure like that, although I do feel like he may have been willing if he would have had a full erection at that time along with pain.  He is told that ultimately he is going to need management by Dr. August Saucer with evaluation of his hemoglobin electrophoresis and decision about whether transfusion is necessary.  It may be possible to put him on some medication to try to reduce the incidence of recurrent priapism, but this can be a difficult and frustrating problem.  At this point I think conservative therapy is indicated.  I contacted Dr. Alfonso Patten partner, who thought he could receive some hydration in the emergency room and potentially be sent home.  PLAN:  Our plan will be to hydrate him with a couple of liters of fluid and have him follow up with Dr. August Saucer for evaluation of possible transfusion and additional urology consult if he would like.  If he has a recurrent erection of more than one hours duration he is told to please come back to the emergency room so that we can, indeed, perform the usual maneuvers including blood gas determination and irrigation of the corpora.  Blood hemoglobin was drawn but is pending at this time. Dictated by:   Barron Alvine, M.D. Attending Physician:  Donnetta Hutching DD:  03/14/02 TD:  03/16/02 Job: [id number]

## 2011-02-15 NOTE — Op Note (Signed)
NAME:  [name], [name]                        ACCOUNT NO.:  [id number]   MEDICAL RECORD NO.:  [id number]                   PATIENT TYPE:  AMB   LOCATION:  NESC                                 FACILITY:  [facility]   PHYSICIAN:  Lindaann Slough, M.D.               DATE OF BIRTH:  04-14-1976   DATE OF PROCEDURE:  02/14/2003  DATE OF DISCHARGE:                                 OPERATIVE REPORT   PREOPERATIVE DIAGNOSIS:  Recurrent priapism.   POSTOPERATIVE DIAGNOSIS:  Recurrent priapism.   OPERATION PERFORMED:  Al-Ghorab shunt (glans to corpora cavernosa)   SURGEON:  Lindaann Slough, M.D.   ANESTHESIA:  General.   INDICATIONS FOR PROCEDURE:  The patient is a 35 year old male with known  sickle cell disease who has been having recurrent priapism for the past  several months.  He was treated with amyl nitrate.  Then he did  not get any  response from amyl nitrate and was having painful erections every night.  It  was discussed with the patient regarding a cavernosa spongiosum shunt and he  would like to proceed with the shunt.  He understands the risks and the  benefits and the alternatives to the procedure.  The risks include but are  not limited to hemorrhage, infection, and impotence and recurrence of the  priapism.  This was also discussed with his wife and they both understand  and are agreeable.  He is scheduled today for the procedure.   DESCRIPTION OF PROCEDURE:  Under general anesthesia, the patient was prepped  and draped and placed in supine position.  The glans penis was infiltrated  with 1% lidocaine with epinephrine at about 1 cm distal to the coronal ridge  and the area of infiltration was about 2 cm long.  Then a transverse  incision was made over the area and the incision was about 2 cm long and 1  cm distal to the coronal ridge.  The incision was carried down to the tunica  of the corpora and the spongy tissues of the glans penis were dissected off  the tunica of the  corpora and then a 5 mm segment of corpora was excised and  that includes also the septum.  Dark blood was drained out of the corpora  and there was good detumescence of the corpora.  Then the superficial layer  of the incision on the glans penis was approximated with #2-0 Vicryl.   The patient tolerated the procedure well and left the operating room in  satisfactory condition to post anesthesia care unit.                                                Lindaann Slough, M.D.    MN/MEDQ  D:  02/14/2003  T:  02/14/2003  Job:  [id number]  

## 2011-06-30 ENCOUNTER — Inpatient Hospital Stay (HOSPITAL_COMMUNITY)
Admission: EM | Admit: 2011-06-30 | Discharge: 2011-07-05 | DRG: 811 | Disposition: A | Discharge status: Home / Self Care | Payer: PRIVATE HEALTH INSURANCE | Attending: Internal Medicine | Admitting: Internal Medicine

## 2011-06-30 ENCOUNTER — Emergency Department (HOSPITAL_COMMUNITY): Payer: PRIVATE HEALTH INSURANCE

## 2011-06-30 DIAGNOSIS — N183 Chronic kidney disease, stage 3 unspecified: Secondary | ICD-10-CM | POA: Diagnosis present

## 2011-06-30 DIAGNOSIS — Z7982 Long term (current) use of aspirin: Secondary | ICD-10-CM

## 2011-06-30 DIAGNOSIS — J9600 Acute respiratory failure, unspecified whether with hypoxia or hypercapnia: Secondary | ICD-10-CM | POA: Diagnosis present

## 2011-06-30 DIAGNOSIS — I509 Heart failure, unspecified: Secondary | ICD-10-CM | POA: Diagnosis present

## 2011-06-30 DIAGNOSIS — D5700 Hb-SS disease with crisis, unspecified: Principal | ICD-10-CM | POA: Diagnosis present

## 2011-06-30 DIAGNOSIS — I5021 Acute systolic (congestive) heart failure: Secondary | ICD-10-CM | POA: Diagnosis present

## 2011-06-30 DIAGNOSIS — I129 Hypertensive chronic kidney disease with stage 1 through stage 4 chronic kidney disease, or unspecified chronic kidney disease: Secondary | ICD-10-CM | POA: Diagnosis present

## 2011-06-30 LAB — BASIC METABOLIC PANEL
Chloride: 110 mEq/L (ref 96–112)
GFR calc Af Amer: 25 mL/min — ABNORMAL LOW (ref >60)
GFR calc non Af Amer: 21 mL/min — ABNORMAL LOW (ref >60)
Glucose, Bld: 86 mg/dL (ref 70–99)
Potassium: 4.2 mEq/L (ref 3.5–5.1)
Sodium: 140 mEq/L (ref 135–145)

## 2011-06-30 LAB — DIFFERENTIAL
Basophils Relative: 1 % (ref 0–1)
Eosinophils Relative: 1 % (ref 0–5)
Monocytes Absolute: 1.8 10*3/uL — ABNORMAL HIGH (ref 0.1–1.0)
Neutro Abs: 13.8 10*3/uL — ABNORMAL HIGH (ref 1.7–7.7)
Neutrophils Relative %: 82 % — ABNORMAL HIGH (ref 43–77)

## 2011-06-30 LAB — CBC
HCT: 13.5 % — ABNORMAL LOW (ref 39.0–52.0)
Hemoglobin: 4.9 g/dL — CL (ref 13.0–17.0)
MCHC: 36.3 g/dL — ABNORMAL HIGH (ref 30.0–36.0)
MCV: 113.4 fL — ABNORMAL HIGH (ref 78.0–100.0)

## 2011-06-30 LAB — RETICULOCYTES
RBC.: 1.19 MIL/uL — ABNORMAL LOW (ref 4.22–5.81)
Retic Count, Absolute: 260.6 10*3/uL — ABNORMAL HIGH (ref 19.0–186.0)

## 2011-06-30 LAB — COMPREHENSIVE METABOLIC PANEL
ALT: 15 U/L (ref 0–53)
AST: 44 U/L — ABNORMAL HIGH (ref 0–37)
Calcium: 9.2 mg/dL (ref 8.4–10.5)
Potassium: 4.5 mEq/L (ref 3.5–5.1)
Sodium: 140 mEq/L (ref 135–145)
Total Protein: 6.5 g/dL (ref 6.0–8.3)

## 2011-07-01 LAB — CBC
HCT: 15.2 % — ABNORMAL LOW (ref 39.0–52.0)
HCT: 23.8 — ABNORMAL LOW
Hemoglobin: 5.6 g/dL — CL (ref 13.0–17.0)
Hemoglobin: 8.6 — ABNORMAL LOW
MCHC: 36
MCV: 105.6 fL — ABNORMAL HIGH (ref 78.0–100.0)
Platelets: 275 10*3/uL (ref 150–400)
RBC: 1.44 MIL/uL — ABNORMAL LOW (ref 4.22–5.81)
RDW: 22 — ABNORMAL HIGH
WBC: 16.5 10*3/uL — ABNORMAL HIGH (ref 4.0–10.5)

## 2011-07-01 LAB — DIFFERENTIAL
Basophils Relative: 1
Eosinophils Absolute: 0.3
Lymphocytes Relative: 18
Neutro Abs: 9.9 — ABNORMAL HIGH

## 2011-07-01 LAB — BASIC METABOLIC PANEL
CO2: 22 mEq/L (ref 19–32)
Chloride: 110 mEq/L (ref 96–112)
Potassium: 4.5 mEq/L (ref 3.5–5.1)
Sodium: 142 mEq/L (ref 135–145)

## 2011-07-01 LAB — TSH: TSH: 3.716 u[IU]/mL (ref 0.350–4.500)

## 2011-07-02 LAB — COMPREHENSIVE METABOLIC PANEL
ALT: 15 U/L (ref 0–53)
Alkaline Phosphatase: 86 U/L (ref 39–117)
CO2: 22 mEq/L (ref 19–32)
Chloride: 106 mEq/L (ref 96–112)
GFR calc Af Amer: 22 mL/min — ABNORMAL LOW (ref >90)
GFR calc non Af Amer: 19 mL/min — ABNORMAL LOW (ref >90)
Glucose, Bld: 97 mg/dL (ref 70–99)
Potassium: 4.6 mEq/L (ref 3.5–5.1)
Sodium: 140 mEq/L (ref 135–145)
Total Bilirubin: 2.2 mg/dL — ABNORMAL HIGH (ref 0.3–1.2)

## 2011-07-02 LAB — CBC
MCH: 35.6 pg — ABNORMAL HIGH (ref 26.0–34.0)
MCV: 96.9 fL (ref 78.0–100.0)
Platelets: 271 10*3/uL (ref 150–400)
RBC: 1.94 MIL/uL — ABNORMAL LOW (ref 4.22–5.81)

## 2011-07-03 LAB — CBC
MCH: 34 pg (ref 26.0–34.0)
MCV: 95.7 fL (ref 78.0–100.0)
Platelets: 270 10*3/uL (ref 150–400)
RDW: 27.5 % — ABNORMAL HIGH (ref 11.5–15.5)

## 2011-07-03 LAB — COMPREHENSIVE METABOLIC PANEL
AST: 31 U/L (ref 0–37)
Albumin: 3.1 g/dL — ABNORMAL LOW (ref 3.5–5.2)
Calcium: 9.5 mg/dL (ref 8.4–10.5)
Creatinine, Ser: 3.62 mg/dL — ABNORMAL HIGH (ref 0.50–1.35)
GFR calc non Af Amer: 20 mL/min — ABNORMAL LOW (ref >90)

## 2011-07-03 LAB — PRO B NATRIURETIC PEPTIDE: Pro B Natriuretic peptide (BNP): 9031 pg/mL — ABNORMAL HIGH (ref 0–125)

## 2011-07-04 LAB — CROSSMATCH
ABO/RH(D): O POS
Antibody Screen: NEGATIVE
Unit division: 0
Unit division: 0

## 2011-07-04 LAB — PREPARE RBC (CROSSMATCH)

## 2011-07-04 LAB — BASIC METABOLIC PANEL
BUN: 57 mg/dL — ABNORMAL HIGH (ref 6–23)
CO2: 24 mEq/L (ref 19–32)
GFR calc non Af Amer: 22 mL/min — ABNORMAL LOW (ref >90)
Glucose, Bld: 86 mg/dL (ref 70–99)
Potassium: 4.6 mEq/L (ref 3.5–5.1)

## 2011-07-04 LAB — CBC
HCT: 19.7 % — ABNORMAL LOW (ref 39.0–52.0)
Hemoglobin: 6.9 g/dL — CL (ref 13.0–17.0)
MCHC: 35 g/dL (ref 30.0–36.0)
RBC: 2.07 MIL/uL — ABNORMAL LOW (ref 4.22–5.81)

## 2011-07-05 LAB — COMPREHENSIVE METABOLIC PANEL
ALT: 14 U/L (ref 0–53)
AST: 37 U/L (ref 0–37)
Albumin: 3.3 g/dL — ABNORMAL LOW (ref 3.5–5.2)
Alkaline Phosphatase: 78 U/L (ref 39–117)
CO2: 24 mEq/L (ref 19–32)
Chloride: 107 mEq/L (ref 96–112)
GFR calc non Af Amer: 24 mL/min — ABNORMAL LOW (ref >90)
Potassium: 5.3 mEq/L — ABNORMAL HIGH (ref 3.5–5.1)
Sodium: 140 mEq/L (ref 135–145)
Total Bilirubin: 1.8 mg/dL — ABNORMAL HIGH (ref 0.3–1.2)

## 2011-07-05 LAB — CBC
MCH: 33 pg (ref 26.0–34.0)
MCHC: 35 g/dL (ref 30.0–36.0)
MCV: 94.4 fL (ref 78.0–100.0)
Platelets: 345 10*3/uL (ref 150–400)

## 2011-07-05 LAB — CROSSMATCH
ABO/RH(D): O POS
Antibody Screen: NEGATIVE

## 2011-07-05 LAB — PRO B NATRIURETIC PEPTIDE: Pro B Natriuretic peptide (BNP): 5615 pg/mL — ABNORMAL HIGH (ref 0–125)

## 2011-07-12 LAB — BASIC METABOLIC PANEL
BUN: 17
Calcium: 9.2
Chloride: 108
Creatinine, Ser: 1.22
GFR calc Af Amer: >60

## 2011-07-12 LAB — DIFFERENTIAL
Basophils Absolute: 0
Lymphs Abs: 0.7
Monocytes Absolute: 1 — ABNORMAL HIGH
Monocytes Relative: 13 — ABNORMAL HIGH
Neutrophils Relative %: 78 — ABNORMAL HIGH

## 2011-07-12 LAB — CBC
MCHC: 33.9
MCV: 92.9
Platelets: 360
WBC: 17.2 — ABNORMAL HIGH

## 2011-07-12 LAB — URINALYSIS, ROUTINE W REFLEX MICROSCOPIC
Nitrite: NEGATIVE
Protein, ur: 100 — AB
Specific Gravity, Urine: 1.015
Urobilinogen, UA: 1

## 2011-07-12 LAB — RETICULOCYTES
RBC.: 2.29 — ABNORMAL LOW
Retic Ct Pct: 11.6 — ABNORMAL HIGH

## 2011-07-12 LAB — URINE MICROSCOPIC-ADD ON

## 2011-09-17 LAB — BASIC METABOLIC PANEL
BUN: 43 mg/dL — AB (ref 4–21)
Creatinine: 3.8 mg/dL — AB (ref 0.6–1.3)
Glucose: 83 mg/dL
Potassium: 4.8 mmol/L (ref 3.4–5.3)
Sodium: 139 mmol/L (ref 137–147)

## 2012-02-29 DEATH — deceased

## 2012-11-05 ENCOUNTER — Encounter: Payer: Self-pay | Admitting: Internal Medicine

## 2012-11-05 ENCOUNTER — Encounter: Payer: Self-pay | Admitting: *Deleted

## 2012-11-26 ENCOUNTER — Encounter: Payer: Self-pay | Admitting: Hematology

## 2013-02-26 ENCOUNTER — Encounter: Payer: Self-pay | Admitting: Internal Medicine

## 2014-01-27 NOTE — Progress Notes (Signed)
Patient ID: Douglas CreeksStacey L xxx Franze, male   DOB: [birth year], 38 y.o.   MRN: [id number] This encounter was created in error - please disregard.
